# Patient Record
Sex: Female | Born: 1985 | Race: White | Hispanic: No | Marital: Married | Smoking: Never smoker
Health system: Southern US, Community
[De-identification: ages and names within clinical notes are randomized; demographics above are authoritative.]

## PROBLEM LIST (undated history)

## (undated) ENCOUNTER — Inpatient Hospital Stay (HOSPITAL_COMMUNITY): Payer: Self-pay

## (undated) DIAGNOSIS — T4145XA Adverse effect of unspecified anesthetic, initial encounter: Secondary | ICD-10-CM

## (undated) DIAGNOSIS — T8859XA Other complications of anesthesia, initial encounter: Secondary | ICD-10-CM

## (undated) DIAGNOSIS — N3090 Cystitis, unspecified without hematuria: Secondary | ICD-10-CM

## (undated) DIAGNOSIS — Z98891 History of uterine scar from previous surgery: Secondary | ICD-10-CM

## (undated) DIAGNOSIS — O165 Unspecified maternal hypertension, complicating the puerperium: Secondary | ICD-10-CM

## (undated) DIAGNOSIS — Z332 Encounter for elective termination of pregnancy: Secondary | ICD-10-CM

## (undated) DIAGNOSIS — Z789 Other specified health status: Secondary | ICD-10-CM

## (undated) DIAGNOSIS — N12 Tubulo-interstitial nephritis, not specified as acute or chronic: Secondary | ICD-10-CM

## (undated) HISTORY — DX: Encounter for elective termination of pregnancy: Z33.2

## (undated) HISTORY — DX: Tubulo-interstitial nephritis, not specified as acute or chronic: N12

## (undated) HISTORY — DX: Cystitis, unspecified without hematuria: N30.90

## (undated) HISTORY — DX: Unspecified maternal hypertension, complicating the puerperium: O16.5

---
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
## 2009-08-15 ENCOUNTER — Emergency Department (HOSPITAL_COMMUNITY): Admission: EM | Admit: 2009-08-15 | Discharge: 2009-08-16 | Payer: Self-pay | Admitting: Emergency Medicine

## 2010-01-14 DIAGNOSIS — N3090 Cystitis, unspecified without hematuria: Secondary | ICD-10-CM

## 2010-01-14 DIAGNOSIS — N12 Tubulo-interstitial nephritis, not specified as acute or chronic: Secondary | ICD-10-CM

## 2010-01-14 HISTORY — DX: Tubulo-interstitial nephritis, not specified as acute or chronic: N12

## 2010-01-14 HISTORY — DX: Cystitis, unspecified without hematuria: N30.90

## 2010-03-12 ENCOUNTER — Emergency Department (HOSPITAL_COMMUNITY)
Admission: EM | Admit: 2010-03-12 | Discharge: 2010-03-12 | Disposition: A | Discharge status: Home / Self Care | Payer: Self-pay | Attending: Emergency Medicine | Admitting: Emergency Medicine

## 2010-03-12 DIAGNOSIS — Y93B2 Activity, push-ups, pull-ups, sit-ups: Secondary | ICD-10-CM | POA: Insufficient documentation

## 2010-03-12 DIAGNOSIS — T148XXA Other injury of unspecified body region, initial encounter: Secondary | ICD-10-CM | POA: Insufficient documentation

## 2010-03-12 DIAGNOSIS — R109 Unspecified abdominal pain: Secondary | ICD-10-CM | POA: Insufficient documentation

## 2010-03-12 DIAGNOSIS — X500XXA Overexertion from strenuous movement or load, initial encounter: Secondary | ICD-10-CM | POA: Insufficient documentation

## 2010-03-30 LAB — URINALYSIS, ROUTINE W REFLEX MICROSCOPIC
Ketones, ur: 15 mg/dL — AB
Nitrite: POSITIVE — AB
Protein, ur: 100 mg/dL — AB
pH: 6 (ref 5.0–8.0)

## 2010-03-30 LAB — CBC
HCT: 32.6 % — ABNORMAL LOW (ref 36.0–46.0)
Hemoglobin: 11.2 g/dL — ABNORMAL LOW (ref 12.0–15.0)
MCH: 31.9 pg (ref 26.0–34.0)
MCHC: 34.4 g/dL (ref 30.0–36.0)
RDW: 12.8 % (ref 11.5–15.5)

## 2010-03-30 LAB — URINE CULTURE
Colony Count: >=100000
Culture  Setup Time: 201108022220

## 2010-03-30 LAB — URINE MICROSCOPIC-ADD ON

## 2010-03-30 LAB — DIFFERENTIAL
Basophils Absolute: 0 10*3/uL (ref 0.0–0.1)
Basophils Relative: 0 % (ref 0–1)
Eosinophils Absolute: 0 10*3/uL (ref 0.0–0.7)
Monocytes Absolute: 1.9 10*3/uL — ABNORMAL HIGH (ref 0.1–1.0)
Monocytes Relative: 14 % — ABNORMAL HIGH (ref 3–12)
Neutrophils Relative %: 80 % — ABNORMAL HIGH (ref 43–77)

## 2010-03-30 LAB — POCT I-STAT, CHEM 8
Calcium, Ion: 1.04 mmol/L — ABNORMAL LOW (ref 1.12–1.32)
Glucose, Bld: 94 mg/dL (ref 70–99)
HCT: 33 % — ABNORMAL LOW (ref 36.0–46.0)
Hemoglobin: 11.2 g/dL — ABNORMAL LOW (ref 12.0–15.0)

## 2010-11-07 ENCOUNTER — Inpatient Hospital Stay (HOSPITAL_COMMUNITY)
Admission: AD | Admit: 2010-11-07 | Discharge: 2010-11-07 | Disposition: A | Discharge status: Home / Self Care | Payer: Medicaid Other | Source: Ambulatory Visit | Attending: Obstetrics and Gynecology | Admitting: Obstetrics and Gynecology

## 2010-11-07 DIAGNOSIS — Z298 Encounter for other specified prophylactic measures: Secondary | ICD-10-CM | POA: Insufficient documentation

## 2010-11-07 DIAGNOSIS — Z3480 Encounter for supervision of other normal pregnancy, unspecified trimester: Secondary | ICD-10-CM | POA: Insufficient documentation

## 2010-11-07 DIAGNOSIS — Z2989 Encounter for other specified prophylactic measures: Secondary | ICD-10-CM | POA: Insufficient documentation

## 2010-11-07 MED ORDER — RHO D IMMUNE GLOBULIN 1500 UNIT/2ML IJ SOLN
300.0000 ug | Freq: Once | INTRAMUSCULAR | Status: AC
  Administered 2010-11-07: 300 ug via INTRAMUSCULAR
  Filled 2010-11-07: qty 2

## 2010-11-07 NOTE — Plan of Care (Signed)
Rhophylac information booklet given to the patient while waiting in the lobby. Explained the 1 1/2 hours required to process this order. Patient understands.

## 2010-11-08 LAB — RH IG WORKUP (INCLUDES ABO/RH): Gestational Age(Wks): 29

## 2010-11-20 ENCOUNTER — Other Ambulatory Visit: Payer: Self-pay | Admitting: Obstetrics and Gynecology

## 2010-12-28 ENCOUNTER — Encounter (HOSPITAL_COMMUNITY): Payer: Self-pay | Admitting: Pharmacist

## 2011-01-10 ENCOUNTER — Encounter (HOSPITAL_COMMUNITY): Payer: Self-pay

## 2011-01-10 ENCOUNTER — Encounter (HOSPITAL_COMMUNITY)
Admission: RE | Admit: 2011-01-10 | Discharge: 2011-01-10 | Disposition: A | Discharge status: Home / Self Care | Payer: Medicaid Other | Source: Ambulatory Visit | Attending: Obstetrics and Gynecology | Admitting: Obstetrics and Gynecology

## 2011-01-10 HISTORY — DX: Other specified health status: Z78.9

## 2011-01-10 HISTORY — DX: Other complications of anesthesia, initial encounter: T88.59XA

## 2011-01-10 HISTORY — DX: Adverse effect of unspecified anesthetic, initial encounter: T41.45XA

## 2011-01-10 LAB — CBC
Platelets: 229 10*3/uL (ref 150–400)
RDW: 13 % (ref 11.5–15.5)
WBC: 8.4 10*3/uL (ref 4.0–10.5)

## 2011-01-10 LAB — SURGICAL PCR SCREEN
MRSA, PCR: NEGATIVE
Staphylococcus aureus: POSITIVE — AB

## 2011-01-10 LAB — RPR: RPR Ser Ql: NONREACTIVE

## 2011-01-10 NOTE — Patient Instructions (Addendum)
YOUR PROCEDURE IS SCHEDULED ON:01/17/11 ENTER THROUGH THE MAIN ENTRANCE OF [facility] AT:9;30 AM USE DESK PHONE AND DIAL [id number] TO INFORM us OF YOUR ARRIVAL  CALL [phone] IF YOU HAVE ANY QUESTIONS OR PROBLEMS PRIOR TO YOUR ARRIVAL.  REMEMBER: DO NOT EAT OR DRINK AFTER MIDNIGHT :Wed  SPECIAL INSTRUCTIONS:   YOU MAY BRUSH YOUR TEETH THE MORNING OF SURGERY   TAKE THESE MEDICINES THE DAY OF SURGERY WITH SIP OF WATER:none   DO NOT WEAR JEWELRY, EYE MAKEUP, LIPSTICK OR DARK FINGERNAIL POLISH DO NOT WEAR LOTIONS  DO NOT SHAVE FOR 48 HOURS PRIOR TO SURGERY  YOU WILL NOT BE ALLOWED TO DRIVE YOURSELF HOME.  NAME OF DRIVER:Rusty Caudell

## 2011-01-15 ENCOUNTER — Encounter (HOSPITAL_COMMUNITY)
Admission: AD | Disposition: A | Discharge status: Home / Self Care | Payer: Self-pay | Source: Ambulatory Visit | Attending: Obstetrics and Gynecology

## 2011-01-15 ENCOUNTER — Encounter (HOSPITAL_COMMUNITY): Payer: Self-pay | Admitting: Anesthesiology

## 2011-01-15 ENCOUNTER — Encounter (HOSPITAL_COMMUNITY): Payer: Self-pay | Admitting: *Deleted

## 2011-01-15 ENCOUNTER — Inpatient Hospital Stay (HOSPITAL_COMMUNITY): Payer: Medicaid Other | Admitting: Anesthesiology

## 2011-01-15 ENCOUNTER — Inpatient Hospital Stay (HOSPITAL_COMMUNITY)
Admission: AD | Admit: 2011-01-15 | Discharge: 2011-01-18 | DRG: 766 | Disposition: A | Discharge status: Home / Self Care | Payer: Medicaid Other | Source: Ambulatory Visit | Attending: Obstetrics and Gynecology | Admitting: Obstetrics and Gynecology

## 2011-01-15 DIAGNOSIS — Z01818 Encounter for other preprocedural examination: Secondary | ICD-10-CM

## 2011-01-15 DIAGNOSIS — Z01812 Encounter for preprocedural laboratory examination: Secondary | ICD-10-CM

## 2011-01-15 DIAGNOSIS — Z6791 Unspecified blood type, Rh negative: Secondary | ICD-10-CM

## 2011-01-15 DIAGNOSIS — O34219 Maternal care for unspecified type scar from previous cesarean delivery: Principal | ICD-10-CM | POA: Diagnosis present

## 2011-01-15 DIAGNOSIS — Z3490 Encounter for supervision of normal pregnancy, unspecified, unspecified trimester: Secondary | ICD-10-CM

## 2011-01-15 DIAGNOSIS — Z98891 History of uterine scar from previous surgery: Secondary | ICD-10-CM

## 2011-01-15 SURGERY — Surgical Case
Anesthesia: Regional

## 2011-01-15 MED ORDER — NALOXONE HCL 0.4 MG/ML IJ SOLN: 0.4000 mg | INTRAMUSCULAR | Status: DC | PRN

## 2011-01-15 MED ORDER — WITCH HAZEL-GLYCERIN EX PADS: 1.0000 "application " | MEDICATED_PAD | CUTANEOUS | Status: DC | PRN

## 2011-01-15 MED ORDER — LANOLIN HYDROUS EX OINT: 1.0000 "application " | TOPICAL_OINTMENT | CUTANEOUS | Status: DC | PRN

## 2011-01-15 MED ORDER — SIMETHICONE 80 MG PO CHEW
80.0000 mg | CHEWABLE_TABLET | Freq: Three times a day (TID) | ORAL | Status: DC
  Administered 2011-01-15 – 2011-01-18 (×10): 80 mg via ORAL

## 2011-01-15 MED ORDER — PHENYLEPHRINE 40 MCG/ML (10ML) SYRINGE FOR IV PUSH (FOR BLOOD PRESSURE SUPPORT)
PREFILLED_SYRINGE | INTRAVENOUS | Status: AC
  Filled 2011-01-15: qty 5

## 2011-01-15 MED ORDER — PROMETHAZINE HCL 25 MG/ML IJ SOLN
25.0000 mg | Freq: Once | INTRAMUSCULAR | Status: AC
  Administered 2011-01-15: 25 mg via INTRAVENOUS
  Filled 2011-01-15: qty 1

## 2011-01-15 MED ORDER — CITRIC ACID-SODIUM CITRATE 334-500 MG/5ML PO SOLN
30.0000 mL | Freq: Once | ORAL | Status: AC
  Administered 2011-01-15: 30 mL via ORAL
  Filled 2011-01-15: qty 15

## 2011-01-15 MED ORDER — PRENATAL MULTIVITAMIN CH
1.0000 | ORAL_TABLET | Freq: Every day | ORAL | Status: DC
  Administered 2011-01-16 – 2011-01-18 (×3): 1 via ORAL
  Filled 2011-01-15 (×3): qty 1

## 2011-01-15 MED ORDER — MORPHINE SULFATE 0.5 MG/ML IJ SOLN
INTRAMUSCULAR | Status: AC
  Filled 2011-01-15: qty 10

## 2011-01-15 MED ORDER — MEPERIDINE HCL 25 MG/ML IJ SOLN
INTRAMUSCULAR | Status: DC | PRN
  Administered 2011-01-15: 25 mg via INTRAVENOUS

## 2011-01-15 MED ORDER — KETOROLAC TROMETHAMINE 60 MG/2ML IM SOLN
INTRAMUSCULAR | Status: AC
  Administered 2011-01-15: 60 mg via INTRAMUSCULAR
  Filled 2011-01-15: qty 2

## 2011-01-15 MED ORDER — MEASLES, MUMPS & RUBELLA VAC ~~LOC~~ INJ
0.5000 mL | INJECTION | Freq: Once | SUBCUTANEOUS | Status: DC
  Filled 2011-01-15: qty 0.5

## 2011-01-15 MED ORDER — MEPERIDINE HCL 25 MG/ML IJ SOLN
INTRAMUSCULAR | Status: AC
  Filled 2011-01-15: qty 1

## 2011-01-15 MED ORDER — FENTANYL CITRATE 0.05 MG/ML IJ SOLN
INTRAMUSCULAR | Status: DC | PRN
  Administered 2011-01-15: 25 ug via INTRATHECAL

## 2011-01-15 MED ORDER — IBUPROFEN 600 MG PO TABS
600.0000 mg | ORAL_TABLET | Freq: Four times a day (QID) | ORAL | Status: DC
  Administered 2011-01-15 – 2011-01-18 (×12): 600 mg via ORAL
  Filled 2011-01-15 (×6): qty 1

## 2011-01-15 MED ORDER — DIPHENHYDRAMINE HCL 50 MG/ML IJ SOLN: 12.5000 mg | INTRAMUSCULAR | Status: DC | PRN

## 2011-01-15 MED ORDER — OXYTOCIN 20 UNITS IN LACTATED RINGERS INFUSION - SIMPLE
INTRAVENOUS | Status: DC | PRN
  Administered 2011-01-15: 20 [IU] via INTRAVENOUS

## 2011-01-15 MED ORDER — SODIUM CHLORIDE 0.9 % IV SOLN
1.0000 ug/kg/h | INTRAVENOUS | Status: DC | PRN
  Filled 2011-01-15: qty 2.5

## 2011-01-15 MED ORDER — CEFAZOLIN SODIUM-DEXTROSE 2-3 GM-% IV SOLR
2.0000 g | Freq: Once | INTRAVENOUS | Status: DC
  Filled 2011-01-15: qty 50

## 2011-01-15 MED ORDER — FAMOTIDINE IN NACL 20-0.9 MG/50ML-% IV SOLN
20.0000 mg | Freq: Once | INTRAVENOUS | Status: AC
  Administered 2011-01-15: 20 mg via INTRAVENOUS
  Filled 2011-01-15: qty 50

## 2011-01-15 MED ORDER — MORPHINE SULFATE (PF) 0.5 MG/ML IJ SOLN
INTRAMUSCULAR | Status: DC | PRN
  Administered 2011-01-15: .15 mg via INTRATHECAL

## 2011-01-15 MED ORDER — SIMETHICONE 80 MG PO CHEW
80.0000 mg | CHEWABLE_TABLET | ORAL | Status: DC | PRN
Start: 2011-01-15 — End: 2011-01-18

## 2011-01-15 MED ORDER — LACTATED RINGERS IV BOLUS (SEPSIS)
1000.0000 mL | Freq: Once | INTRAVENOUS | Status: AC
  Administered 2011-01-15: 1000 mL via INTRAVENOUS

## 2011-01-15 MED ORDER — DEXTROSE 5 % IV SOLN
1.0000 g | INTRAVENOUS | Status: AC
  Administered 2011-01-15: 1 g via INTRAVENOUS
  Filled 2011-01-15: qty 1

## 2011-01-15 MED ORDER — PHENYLEPHRINE HCL 10 MG/ML IJ SOLN
INTRAMUSCULAR | Status: DC | PRN
  Administered 2011-01-15: 80 ug via INTRAVENOUS

## 2011-01-15 MED ORDER — MENTHOL 3 MG MT LOZG: 1.0000 | LOZENGE | OROMUCOSAL | Status: DC | PRN

## 2011-01-15 MED ORDER — NALBUPHINE SYRINGE 5 MG/0.5 ML
5.0000 mg | INJECTION | INTRAMUSCULAR | Status: DC | PRN
  Filled 2011-01-15: qty 1

## 2011-01-15 MED ORDER — KETOROLAC TROMETHAMINE 30 MG/ML IJ SOLN: 30.0000 mg | Freq: Four times a day (QID) | INTRAMUSCULAR | Status: AC | PRN

## 2011-01-15 MED ORDER — NALBUPHINE SYRINGE 5 MG/0.5 ML
10.0000 mg | INJECTION | Freq: Once | INTRAMUSCULAR | Status: AC
  Administered 2011-01-15: 10 mg via INTRAVENOUS
  Filled 2011-01-15: qty 1

## 2011-01-15 MED ORDER — IBUPROFEN 600 MG PO TABS
600.0000 mg | ORAL_TABLET | Freq: Four times a day (QID) | ORAL | Status: DC | PRN
  Filled 2011-01-15 (×5): qty 1

## 2011-01-15 MED ORDER — KETOROLAC TROMETHAMINE 60 MG/2ML IM SOLN
60.0000 mg | Freq: Once | INTRAMUSCULAR | Status: AC | PRN
  Administered 2011-01-15: 60 mg via INTRAMUSCULAR

## 2011-01-15 MED ORDER — TETANUS-DIPHTH-ACELL PERTUSSIS 5-2.5-18.5 LF-MCG/0.5 IM SUSP: 0.5000 mL | Freq: Once | INTRAMUSCULAR | Status: DC

## 2011-01-15 MED ORDER — OXYTOCIN 10 UNIT/ML IJ SOLN
INTRAMUSCULAR | Status: AC
  Filled 2011-01-15: qty 4

## 2011-01-15 MED ORDER — FLEET ENEMA 7-19 GM/118ML RE ENEM: 1.0000 | ENEMA | Freq: Every day | RECTAL | Status: DC | PRN

## 2011-01-15 MED ORDER — ONDANSETRON HCL 4 MG/2ML IJ SOLN
INTRAMUSCULAR | Status: DC | PRN
  Administered 2011-01-15: 4 mg via INTRAVENOUS

## 2011-01-15 MED ORDER — METOCLOPRAMIDE HCL 5 MG/ML IJ SOLN: 10.0000 mg | Freq: Three times a day (TID) | INTRAMUSCULAR | Status: DC | PRN

## 2011-01-15 MED ORDER — ZOLPIDEM TARTRATE 5 MG PO TABS: 5.0000 mg | ORAL_TABLET | Freq: Every evening | ORAL | Status: DC | PRN

## 2011-01-15 MED ORDER — SCOPOLAMINE 1 MG/3DAYS TD PT72
MEDICATED_PATCH | TRANSDERMAL | Status: AC
  Filled 2011-01-15: qty 1

## 2011-01-15 MED ORDER — SCOPOLAMINE 1 MG/3DAYS TD PT72
1.0000 | MEDICATED_PATCH | Freq: Once | TRANSDERMAL | Status: AC
  Administered 2011-01-15: 1.5 mg via TRANSDERMAL

## 2011-01-15 MED ORDER — FENTANYL CITRATE 0.05 MG/ML IJ SOLN
INTRAMUSCULAR | Status: AC
  Filled 2011-01-15: qty 2

## 2011-01-15 MED ORDER — SODIUM CHLORIDE 0.9 % IJ SOLN: 3.0000 mL | INTRAMUSCULAR | Status: DC | PRN

## 2011-01-15 MED ORDER — DIPHENHYDRAMINE HCL 25 MG PO CAPS: 25.0000 mg | ORAL_CAPSULE | ORAL | Status: DC | PRN

## 2011-01-15 MED ORDER — OXYTOCIN 20 UNITS IN LACTATED RINGERS INFUSION - SIMPLE
125.0000 mL/h | INTRAVENOUS | Status: AC
  Administered 2011-01-15: 125 mL/h via INTRAVENOUS

## 2011-01-15 MED ORDER — MEPERIDINE HCL 25 MG/ML IJ SOLN: 6.2500 mg | INTRAMUSCULAR | Status: DC | PRN

## 2011-01-15 MED ORDER — DIPHENHYDRAMINE HCL 25 MG PO CAPS: 25.0000 mg | ORAL_CAPSULE | Freq: Four times a day (QID) | ORAL | Status: DC | PRN

## 2011-01-15 MED ORDER — SENNOSIDES-DOCUSATE SODIUM 8.6-50 MG PO TABS
2.0000 | ORAL_TABLET | Freq: Every day | ORAL | Status: DC
  Administered 2011-01-15 – 2011-01-17 (×3): 2 via ORAL

## 2011-01-15 MED ORDER — DIBUCAINE 1 % RE OINT: 1.0000 "application " | TOPICAL_OINTMENT | RECTAL | Status: DC | PRN

## 2011-01-15 MED ORDER — HYDROMORPHONE HCL PF 1 MG/ML IJ SOLN: 0.2500 mg | INTRAMUSCULAR | Status: DC | PRN

## 2011-01-15 MED ORDER — FERROUS SULFATE 325 (65 FE) MG PO TABS
325.0000 mg | ORAL_TABLET | Freq: Two times a day (BID) | ORAL | Status: DC
  Administered 2011-01-15 – 2011-01-18 (×6): 325 mg via ORAL
  Filled 2011-01-15 (×6): qty 1

## 2011-01-15 MED ORDER — DIPHENHYDRAMINE HCL 50 MG/ML IJ SOLN: 25.0000 mg | INTRAMUSCULAR | Status: DC | PRN

## 2011-01-15 MED ORDER — LACTATED RINGERS IV SOLN
INTRAVENOUS | Status: DC
  Administered 2011-01-15: 12:00:00 via INTRAVENOUS

## 2011-01-15 MED ORDER — KETOROLAC TROMETHAMINE 30 MG/ML IJ SOLN
30.0000 mg | Freq: Four times a day (QID) | INTRAMUSCULAR | Status: AC | PRN
  Administered 2011-01-15: 30 mg via INTRAVENOUS
  Filled 2011-01-15: qty 1

## 2011-01-15 MED ORDER — BISACODYL 10 MG RE SUPP: 10.0000 mg | Freq: Every day | RECTAL | Status: DC | PRN

## 2011-01-15 MED ORDER — LACTATED RINGERS IV SOLN
INTRAVENOUS | Status: DC | PRN
  Administered 2011-01-15 (×3): via INTRAVENOUS

## 2011-01-15 MED ORDER — ONDANSETRON HCL 4 MG PO TABS: 4.0000 mg | ORAL_TABLET | ORAL | Status: DC | PRN

## 2011-01-15 MED ORDER — OXYTOCIN 20 UNITS IN LACTATED RINGERS INFUSION - SIMPLE
INTRAVENOUS | Status: AC
  Administered 2011-01-15: 125 mL/h via INTRAVENOUS
  Filled 2011-01-15: qty 1000

## 2011-01-15 MED ORDER — ONDANSETRON HCL 4 MG/2ML IJ SOLN
INTRAMUSCULAR | Status: AC
  Filled 2011-01-15: qty 2

## 2011-01-15 MED ORDER — OXYCODONE-ACETAMINOPHEN 5-325 MG PO TABS
1.0000 | ORAL_TABLET | ORAL | Status: DC | PRN
  Administered 2011-01-15 – 2011-01-18 (×13): 1 via ORAL
  Filled 2011-01-15 (×12): qty 1

## 2011-01-15 MED ORDER — ONDANSETRON HCL 4 MG/2ML IJ SOLN
4.0000 mg | INTRAMUSCULAR | Status: DC | PRN
Start: 2011-01-15 — End: 2011-01-18

## 2011-01-15 MED ORDER — ONDANSETRON HCL 4 MG/2ML IJ SOLN: 4.0000 mg | Freq: Three times a day (TID) | INTRAMUSCULAR | Status: DC | PRN

## 2011-01-15 SURGICAL SUPPLY — 38 items
BENZOIN TINCTURE PRP APPL 2/3 (GAUZE/BANDAGES/DRESSINGS) ×2 IMPLANT
CHLORAPREP W/TINT 26ML (MISCELLANEOUS) ×2 IMPLANT
CLOTH BEACON ORANGE TIMEOUT ST (SAFETY) ×2 IMPLANT
CONTAINER PREFILL 10% NBF 15ML (MISCELLANEOUS) IMPLANT
DRAIN JACKSON PRT FLT 10 (DRAIN) ×2 IMPLANT
DRESSING TELFA 8X3 (GAUZE/BANDAGES/DRESSINGS) ×2 IMPLANT
DRSG COVADERM 4X10 (GAUZE/BANDAGES/DRESSINGS) ×2 IMPLANT
ELECT REM PT RETURN 9FT ADLT (ELECTROSURGICAL) ×2
ELECTRODE REM PT RTRN 9FT ADLT (ELECTROSURGICAL) ×1 IMPLANT
EVACUATOR SILICONE 100CC (DRAIN) ×2 IMPLANT
EXTRACTOR VACUUM M CUP 4 TUBE (SUCTIONS) IMPLANT
GAUZE SPONGE 4X4 12PLY STRL LF (GAUZE/BANDAGES/DRESSINGS) ×2 IMPLANT
GLOVE BIO SURGEON STRL SZ7.5 (GLOVE) ×4 IMPLANT
GLOVE BIOGEL PI IND STRL 7.5 (GLOVE) ×2 IMPLANT
GLOVE BIOGEL PI INDICATOR 7.5 (GLOVE) ×2
GLOVE SURG SS PI 7.5 STRL IVOR (GLOVE) ×2 IMPLANT
GOWN PREVENTION PLUS LG XLONG (DISPOSABLE) ×6 IMPLANT
KIT ABG SYR 3ML LUER SLIP (SYRINGE) IMPLANT
NEEDLE HYPO 22GX1.5 SAFETY (NEEDLE) IMPLANT
NEEDLE HYPO 25X5/8 SAFETYGLIDE (NEEDLE) IMPLANT
NS IRRIG 1000ML POUR BTL (IV SOLUTION) ×2 IMPLANT
PACK C SECTION WH (CUSTOM PROCEDURE TRAY) ×2 IMPLANT
PAD ABD 7.5X8 STRL (GAUZE/BANDAGES/DRESSINGS) ×2 IMPLANT
RETRACTOR WND ALEXIS 25 LRG (MISCELLANEOUS) ×1 IMPLANT
RTRCTR WOUND ALEXIS 25CM LRG (MISCELLANEOUS) ×2
SLEEVE SCD COMPRESS KNEE MED (MISCELLANEOUS) IMPLANT
STRIP CLOSURE SKIN 1/2X4 (GAUZE/BANDAGES/DRESSINGS) ×2 IMPLANT
SUT CHROMIC 2 0 CT 1 (SUTURE) ×2 IMPLANT
SUT MNCRL AB 3-0 PS2 27 (SUTURE) ×2 IMPLANT
SUT PLAIN 0 NONE (SUTURE) IMPLANT
SUT PLAIN 2 0 XLH (SUTURE) ×2 IMPLANT
SUT VIC AB 0 CT1 36 (SUTURE) ×2 IMPLANT
SUT VIC AB 0 CTX 36 (SUTURE) ×3
SUT VIC AB 0 CTX36XBRD ANBCTRL (SUTURE) ×3 IMPLANT
SYR CONTROL 10ML LL (SYRINGE) IMPLANT
TOWEL OR 17X24 6PK STRL BLUE (TOWEL DISPOSABLE) ×4 IMPLANT
TRAY FOLEY CATH 14FR (SET/KITS/TRAYS/PACK) ×2 IMPLANT
WATER STERILE IRR 1000ML POUR (IV SOLUTION) ×2 IMPLANT

## 2011-01-15 NOTE — Op Note (Signed)
Cesarean Section Procedure Note   Katherine Riggs  01/15/2011  Indications: Scheduled Proceedure/Maternal Request and and contractions   Pre-operative Diagnosis: Previous Cesarean, in labor.   Post-operative Diagnosis: Same   Surgeon: Surgeon(s) and Role:    * Michael Litter, MD - Primary   Assistants: Gevena Barre  Anesthesia: spinal   Procedure Details:  The patient was seen in the Holding Room. The risks, benefits, complications, treatment options, and expected outcomes were discussed with the patient. The patient concurred with the proposed plan, giving informed consent. identified as Katherine Riggs and the procedure verified as C-Section Delivery. A Time Out was held and the above information confirmed.  After induction of anesthesia, the patient was draped and prepped in the usual sterile manner. A transverse incision was made and carried down through the subcutaneous tissue to the fascia. Fascial incision was made in the midline and extended transversely. The fascia was separated from the underlying rectus muscle superiorly and inferiorly. The peritoneum was identified and entered. Peritoneal incision was extended longitudinally with good visualization of bowel and bladder. The utero-vesical peritoneal reflection was incised transversely and the bladder flap was bluntly freed from the lower uterine segment.An alexis retractor was placed in the abdomen.   A low transverse uterine incision was made. Delivered from cephalic presentation was a 5-14 pound infant, with Apgar scores of 9 at one minute and 10 at five minutes. Cord ph was not sent the umbilical cord was clamped and cut cord blood was obtained for evaluation. The placenta was removed Intact and appeared normal. The uterine outline, tubes and ovaries appeared normal}. The uterine incision was closed with running locked sutures of 0Vicryl. A second layer 0 vicrlyl was used to imbricate the uterine incision.   Two figure of eight suture  with 0 vicryl was used to obtain hemostasis.      Hemostasis was observed. Lavage was carried out until clear. The fascia was then reapproximated with running sutures of 0plain gut. The subcuticular closure was performed using 3-60monocryl     Instrument, sponge, and needle counts were correct prior the abdominal closure and were correct at the conclusion of the case.    Findings:female infant in vtx presentation. Nuchal cord times one, easily reduced.  Clear fluid.  Normal appearing uterus, tubes ovaries and abdominal anatomy   Estimated Blood Loss  700cc  Total IV Fluids:   Urine Output: 500CC OF clear urine  Specimens: none  Complications: no complications  Disposition: PACU - hemodynamically stable.   Maternal Condition: stable   Baby condition / location:  nursery-stable  Attending Attestation: I was present and scrubbed for the entire procedure.   Signed: Surgeon(s): Michael Litter, MD

## 2011-01-15 NOTE — Addendum Note (Signed)
Addendum  created 01/15/11 1603 by Fanny Dance   Modules edited:Notes Section

## 2011-01-15 NOTE — H&P (Signed)
Katherine Riggs is a 26 y.o. female presenting for ctx. See MAU note.  HPI: Pt began PNC at [facility] at 17wks. She had an anatomy US at 20w that was normal, however EDC was changed to 1/8 from 12/26 based on 20wk measurements. Pt had a normal quad screen. She received rhophylac at 29wks, and had a normal 1hr gtt, NR RPR and a hgb of 10.9. GBS was neg done at 35.6wks. Pt elected to be scheduled for repeat c/s.    Maternal Medical History:  Reason for admission: Reason for admission: contractions.  Contractions: Onset was 2 days ago.   Frequency: regular.   Duration is approximately 50 seconds.   Perceived severity is moderate.    Fetal activity: Perceived fetal activity is normal.    Prenatal complications: no prenatal complications   OB History    Grav Para Term Preterm Abortions TAB SAB Ect Mult Living   3 1 1  1 1    1      12/08 - C/S for malposition - female 6#6oz at approx 39wks - reports PP HTN - no meds 12/10 - EAB no comps 12/11 - EAB no comps   Past Medical History  Diagnosis Date  . No pertinent past medical history   . Complication of anesthesia     high BP in PACU- resolved in PACU   Past Surgical History  Procedure Date  . Cesarean section    Family History: family history is not on file. MI - PGM CHTN - PGM, PGF Diabetes - PGM, PA Migraines - mother Breast CA - MGM Esophageal CA - MGM  Social History:  reports that she has never smoked. She does not have any smokeless tobacco history on file. She reports that she does not drink alcohol or use illicit drugs. 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 Lives with Toney Sang, that is a different FOB from first child.   Review of Systems  Musculoskeletal: Positive for joint pain.       Pelvic pain  All other systems reviewed and are negative.    Dilation: 1 Effacement (%): 50 Station: -2 Exam by:: Owais Pruett CNM Blood pressure 129/71, pulse 88, temperature 98.3 F (36.8 C), resp. rate 18, height 5'  1" (1.549 m), weight 91.173 kg (201 lb), last menstrual period 04/04/2010. Maternal Exam:  Uterine Assessment: Contraction strength is moderate.  Contraction duration is 50 seconds. Contraction frequency is regular.   Abdomen: Patient reports no abdominal tenderness. Fundal height is aga.   Estimated fetal weight is 7#.   Fetal presentation: vertex  Introitus: Normal vulva. Normal vagina.  Ferning test: not done.   Pelvis: adequate for delivery.   Cervix: Cervix evaluated by digital exam.     Fetal Exam Fetal Monitor Review: Mode: ultrasound.   Baseline rate: 110.  Variability: moderate (6-25 bpm).   Pattern: accelerations present and no decelerations.    Fetal State Assessment: Category I - tracings are normal.     Physical Exam  Nursing note and vitals reviewed. Constitutional: She is oriented to person, place, and time. She appears well-developed and well-nourished.  Cardiovascular: Normal rate and normal heart sounds.   Respiratory: Effort normal and breath sounds normal.  GI: Soft. Bowel sounds are normal.  Genitourinary: Vagina normal.  Musculoskeletal: Normal range of motion. She exhibits edema.       Bilateral LE 1+  Neurological: She is alert and oriented to person, place, and time. She has normal reflexes.  Skin: Skin is  warm and dry.  Psychiatric: She has a normal mood and affect. Her behavior is normal. Judgment and thought content normal.    Prenatal labs: ABO, Rh: --/--/A NEG (10/24 1208) Antibody: NEG (10/24 1208) Rubella:  IMM RPR: NON REACTIVE (12/27 1010)  HBsAg:   neg HIV:   neg GBS:   neg MRSA PCR - neg  Assessment/Plan: Admit to IP Routine pre-op orders Risks of C/S including bleeding, infection, anesthesia complications, and organ damage reviewed, pt understands these risks and agrees to proceed.     Mahsa Hanser M 01/15/2011, 4:35 AM

## 2011-01-15 NOTE — Anesthesia Preprocedure Evaluation (Addendum)
Anesthesia Evaluation    Airway       Dental   Pulmonary          Cardiovascular     Neuro/Psych    GI/Hepatic   Endo/Other  Morbid obesity  Renal/GU      Musculoskeletal   Abdominal   Peds  Hematology   Anesthesia Other Findings   Reproductive/Obstetrics                          Anesthesia Physical Anesthesia Plan  ASA: III  Anesthesia Plan: Spinal   Post-op Pain Management:    Induction:   Airway Management Planned:   Additional Equipment:   Intra-op Plan:   Post-operative Plan:   Informed Consent: I have reviewed the patients History and Physical, chart, labs and discussed the procedure including the risks, benefits and alternatives for the proposed anesthesia with the patient or authorized representative who has indicated his/her understanding and acceptance.   Dental Advisory Given  Plan Discussed with: CRNA  Anesthesia Plan Comments: (Lab work confirmed with CRNA in room. Platelets okay. Discussed spinal anesthetic, and patient consents to the procedure:  included risk of possible headache,backache, failed block, allergic reaction, and nerve injury. This patient was asked if she had any questions or concerns before the procedure started. )       Anesthesia Quick Evaluation

## 2011-01-15 NOTE — Anesthesia Postprocedure Evaluation (Signed)
Anesthesia Post Note  Patient: Katherine Riggs  Procedure(s) Performed:  CESAREAN SECTION  Anesthesia type: Spinal  Patient location: PACU  Post pain: Pain level controlled  Post assessment: Post-op Vital signs reviewed  Last Vitals:  Filed Vitals:   01/15/11 0910  BP:   Pulse: 60  Temp: 36.6 C  Resp: 16    Post vital signs: stable  Level of consciousness: awake  Complications: No apparent anesthesia complications

## 2011-01-15 NOTE — ED Provider Notes (Signed)
History     Chief Complaint  Patient presents with  . Labor Eval   HPI Comments: Pt is a 25yo G3P1011 at 39w today with cc of ctx. Had called CNM on call Sunday with c/o 20 min ctx, states today they have been 8 min apart and has had increase mucous d/c and pink show. No LOF, +FM. Pt is scheduled for repeat C/S on 01/17/11.  Pregnancy significant for: 1. RH neg 2. LTC 3. HX C/S secondary to head position - desires repeat 4. Hx of high BP at term - no meds  5. Hx pyelo 6. New FOB 7. Hx EAB x2      Past Medical History  Diagnosis Date  . No pertinent past medical history   . Complication of anesthesia     high BP in PACU- resolved in PACU    Past Surgical History  Procedure Date  . Cesarean section     No family history on file.  History  Substance Use Topics  . Smoking status: Never Smoker   . Smokeless tobacco: Not on file  . Alcohol Use: No    Allergies: No Known Allergies  Prescriptions prior to admission  Medication Sig Dispense Refill  . DM-Phenylephrine-Acetaminophen (TYLENOL COLD MULTI-SYMPTOM DAY PO) Take 10 mLs by mouth every 6 (six) hours as needed. For cold symptoms       . Phenylephrine-APAP-Guaifenesin (TYLENOL SINUS CONGESTION/PAIN) 5-325-200 MG TABS Take 2 tablets by mouth every 4 (four) hours as needed. For cold symptoms        . prenatal vitamin w/FE, FA (PRENATAL 1 + 1) 27-1 MG TABS Take 1 tablet by mouth daily.          Review of Systems  Musculoskeletal: Positive for joint pain.       Pelvic pain  All other systems reviewed and are negative.   Physical Exam   Blood pressure 129/71, pulse 88, temperature 98.3 F (36.8 C), resp. rate 18, height 5\' 1"  (1.549 m), weight 91.173 kg (201 lb), last menstrual period 04/04/2010.  Physical Exam  Nursing note and vitals reviewed. Constitutional: She is oriented to person, place, and time. She appears well-developed and well-nourished.  Cardiovascular: Normal rate.   Respiratory: Effort normal.    GI: Soft.  Genitourinary: Vaginal discharge found.       Pink d/c  Musculoskeletal: Normal range of motion.  Neurological: She is alert and oriented to person, place, and time.  Skin: Skin is warm and dry.  Psychiatric: She has a normal mood and affect. Her behavior is normal.   FHR 130 reactive CAT ! toco 5-6 Pelvic - 1cm/100/-2 vtx  MAU Course  Procedures    Assessment and Plan  IUP at 39w Possible early labor  Hx c/s desires repeat GBS neg FHR reassuring preop labs WNL MRSA PCR neg  Will hydrate with LR Nubain/phenergan Recheck cx   Maryah Marinaro M 01/15/2011, 1:54 AM

## 2011-01-15 NOTE — Anesthesia Postprocedure Evaluation (Signed)
  Anesthesia Post-op Note  Patient: Katherine Riggs  Procedure(s) Performed:  CESAREAN SECTION  Patient Location: Mother/Baby  Anesthesia Type: Spinal  Level of Consciousness: alert  and oriented  Airway and Oxygen Therapy: Patient Spontanous Breathing  Post-op Pain: mild  Post-op Assessment: Patient's Cardiovascular Status Stable and Respiratory Function Stable  Post-op Vital Signs: stable  Complications: No apparent anesthesia complications

## 2011-01-15 NOTE — Transfer of Care (Signed)
Immediate Anesthesia Transfer of Care Note  Patient: Katherine Riggs  Procedure(s) Performed:  CESAREAN SECTION  Patient Location: PACU  Anesthesia Type: Spinal  Level of Consciousness: awake  Airway & Oxygen Therapy: Patient Spontanous Breathing  Post-op Assessment: Report given to PACU RN  Post vital signs: Reviewed and stable  Complications: No apparent anesthesia complications

## 2011-01-15 NOTE — Anesthesia Procedure Notes (Signed)

## 2011-01-15 NOTE — Progress Notes (Signed)
S: pt has been sleeping, states ctx are getting stronger again just recently O: FHR 110 reactive, no decels, less accels after pain meds, CAT I toco - initially spaced out after IVF's to 5-6, now are 2-4 Cx=1/long/-2 vtx,  No change, initial exam may have been with ctx  A: term IUP  Ctx persist, no cervical change Likely early/prodrome labor P: D/W Dr Normand Sloop,  Talked to OR OR will call when ready for PT Will consent for repeat C/S

## 2011-01-15 NOTE — Progress Notes (Signed)
Pt presents for contractions q 6 minutes. Denies bleeding or ROM

## 2011-01-16 ENCOUNTER — Encounter (HOSPITAL_COMMUNITY): Payer: Self-pay | Admitting: Obstetrics and Gynecology

## 2011-01-16 LAB — CBC
HCT: 28.4 % — ABNORMAL LOW (ref 36.0–46.0)
Hemoglobin: 9.5 g/dL — ABNORMAL LOW (ref 12.0–15.0)
MCV: 93.1 fL (ref 78.0–100.0)
WBC: 9.3 10*3/uL (ref 4.0–10.5)

## 2011-01-16 MED ORDER — RHO D IMMUNE GLOBULIN 1500 UNIT/2ML IJ SOLN
300.0000 ug | Freq: Once | INTRAMUSCULAR | Status: AC
  Administered 2011-01-16: 300 ug via INTRAMUSCULAR
  Filled 2011-01-16: qty 2

## 2011-01-16 NOTE — Progress Notes (Signed)
Subjective: Postpartum Day 1: Cesarean Delivery--repeat, presented in labor. Patient reports tolerating PO, + flatus and no problems voiding.  Up in shower, no syncope or dizziness. Breastfeeding.   Objective: Vital signs in last 24 hours: Temp:  [97.5 F (36.4 C)-98.7 F (37.1 C)] 98.5 F (36.9 C) (01/02 0620) Pulse Rate:  [65-102] 87  (01/02 0620) Resp:  [16-18] 18  (01/02 0620) BP: (100-124)/(64-80) 112/71 mmHg (01/02 0620) SpO2:  [97 %-100 %] 97 % (01/02 0620)  Physical Exam:  General: alert Lochia: appropriate Uterine Fundus: firm Incision: Dressing CDI before removing in shower. DVT Evaluation: No evidence of DVT seen on physical exam. Negative Homan's sign.  JP drain--45 cc in last 24 hours, serosanguinous drainage.  Basename 01/16/11 0520  HGB 9.5*  HCT 28.4*   Pre-op Hgb 11.4  Assessment/Plan: Status post Cesarean section. Doing well postoperatively.  Continue current care.  Nigel Bridgeman 01/16/2011, 10:27 AM

## 2011-01-16 NOTE — Progress Notes (Signed)
UR chart review completed.  

## 2011-01-17 ENCOUNTER — Encounter (HOSPITAL_COMMUNITY): Admission: RE | Payer: Self-pay | Source: Ambulatory Visit

## 2011-01-17 ENCOUNTER — Inpatient Hospital Stay (HOSPITAL_COMMUNITY)
Admission: RE | Admit: 2011-01-17 | Payer: Medicaid Other | Source: Ambulatory Visit | Admitting: Obstetrics and Gynecology

## 2011-01-17 LAB — RH IG WORKUP (INCLUDES ABO/RH)
ABO/RH(D): A NEG
Fetal Screen: NEGATIVE
Unit division: 0

## 2011-01-17 SURGERY — Surgical Case
Anesthesia: Spinal

## 2011-01-17 NOTE — Progress Notes (Addendum)
Subjective: Postpartum Day 2: Cesarean Delivery, repeat, admitted for labor Patient reports no problems voiding.  Working on feedings.  Pain well-controlled with po meds.  Objective: Vital signs in last 24 hours: Temp:  [98 F (36.7 C)-98.3 F (36.8 C)] 98 F (36.7 C) (01/03 0512) Pulse Rate:  [79-94] 79  (01/03 0512) Resp:  [18] 18  (01/03 0512) BP: (99-111)/(64-78) 99/64 mmHg (01/03 0512)  Physical Exam:  General: alert Lochia: appropriate Uterine Fundus: firm Incision: healing well DVT Evaluation: No evidence of DVT seen on physical exam. 1+ edema, not calf tenderness. Negative Homan's sign.   Basename 01/16/11 0520  HGB 9.5*  HCT 28.4*   JP drain 20 cc last 24 hours  Assessment/Plan: Status post Cesarean section. Doing well postoperatively.  Continue current care. Anticipate d/c 01/18/11.  LATHAM, VICKI 01/17/2011, 1:02 PM  Agree with above - AYR

## 2011-01-18 MED ORDER — IBUPROFEN 600 MG PO TABS: 600.0000 mg | ORAL_TABLET | Freq: Four times a day (QID) | ORAL | Status: AC | PRN

## 2011-01-18 MED ORDER — OXYCODONE-ACETAMINOPHEN 5-325 MG PO TABS: 1.0000 | ORAL_TABLET | ORAL | Status: AC | PRN

## 2011-01-18 NOTE — Discharge Summary (Signed)
Obstetric Discharge Summary Reason for Admission: cesarean section, contractions Prenatal Procedures: ultrasound Intrapartum Procedures: cesarean: low cervical, transverse Postpartum Procedures: none Complications-Operative and Postpartum: none  Hospital course:  presented with contractions unresolved after hydration, had repeat cesarean section, uneventful recovery normal involution. Anemia Hemoglobin  Date Value Range Status  01/16/2011 9.5* 12.0-15.0 (g/dL) Final     HCT  Date Value Range Status  01/16/2011 28.4* 36.0-46.0 (%) Final   Denies needs comfortable with pain meds, undecided birth control Lungs clear bilaterally on auscultation, AP regular, abd soft, nt, bowel sounds active, incision well approximated no drainage, redness, or edema, JP removed intact by Dr. Pennie Rushing. Discharge Diagnoses: Term Pregnancy-delivered repeat C/S  Discharge Information: Date: 01/18/2011 Activity: pelvic rest Diet: regular Medications: Ibuprofen and Percocet Condition: stable Instructions: refer to practice specific booklet Discharge to: home Follow-up Information    Follow up with [facility] in 6 weeks. (As needed)         Birth control options reviewed, undecided.  Newborn Data: Live born female  Birth Weight: 5 lb 14.9 oz (2690 g) APGAR: 9, 10  Home with baby not discharged from hospital.  [facility] 01/18/2011, 1:52 PM

## 2011-11-11 ENCOUNTER — Encounter: Payer: Self-pay | Admitting: Obstetrics and Gynecology

## 2011-11-11 ENCOUNTER — Telehealth: Payer: Self-pay | Admitting: Obstetrics and Gynecology

## 2011-11-11 DIAGNOSIS — Z3490 Encounter for supervision of normal pregnancy, unspecified, unspecified trimester: Secondary | ICD-10-CM

## 2011-11-11 NOTE — Telephone Encounter (Signed)
LM for pt tcb. 

## 2011-11-11 NOTE — Telephone Encounter (Signed)
TC from pt. Pt has NOB scheduled 11/21/11 with [facility]. Pt unsure of LMP "end June middle July or early Aug". Informed pt we will consult with provider to get an order to scheduled her an u/s along with her NOB appt. Pt agrees and voices understanding.

## 2011-11-12 ENCOUNTER — Telehealth: Payer: Self-pay | Admitting: Obstetrics and Gynecology

## 2011-11-12 NOTE — Telephone Encounter (Signed)
Pt returning your call

## 2011-11-12 NOTE — Telephone Encounter (Signed)
Informed pt dating u/s was ordered & was approved by [facility]. U/S appointment scheduled 11/21/11 @ 1p. Pt agrees.

## 2011-11-21 ENCOUNTER — Ambulatory Visit (INDEPENDENT_AMBULATORY_CARE_PROVIDER_SITE_OTHER): Payer: Medicaid Other

## 2011-11-21 ENCOUNTER — Encounter: Payer: Self-pay | Admitting: Obstetrics and Gynecology

## 2011-11-21 ENCOUNTER — Ambulatory Visit (INDEPENDENT_AMBULATORY_CARE_PROVIDER_SITE_OTHER): Payer: Medicaid Other | Admitting: Obstetrics and Gynecology

## 2011-11-21 VITALS — BP 110/68 | Wt 186.6 lb

## 2011-11-21 DIAGNOSIS — O034 Incomplete spontaneous abortion without complication: Secondary | ICD-10-CM | POA: Insufficient documentation

## 2011-11-21 DIAGNOSIS — N12 Tubulo-interstitial nephritis, not specified as acute or chronic: Secondary | ICD-10-CM

## 2011-11-21 DIAGNOSIS — Z3490 Encounter for supervision of normal pregnancy, unspecified, unspecified trimester: Secondary | ICD-10-CM

## 2011-11-21 DIAGNOSIS — Z331 Pregnant state, incidental: Secondary | ICD-10-CM

## 2011-11-21 LAB — POCT URINALYSIS DIPSTICK
Leukocytes, UA: NEGATIVE
Protein, UA: NEGATIVE
Urobilinogen, UA: NEGATIVE

## 2011-11-21 LAB — US OB COMP LESS 14 WKS

## 2011-11-21 NOTE — Progress Notes (Signed)
Subjective:    Katherine Riggs is being seen today for her first obstetrical visit at 69 1/[redacted] weeks gestation by [place] today, due to uncertain LMP.  She reports doing well--some fatigue, occasional dizziness, with feelings like her sugar is low.  No hx diabetes.  Plans repeat C/S, but declines tubal. Will plan long-term contraception (likely Mirena) pp.  Her obstetrical history is significant for: Patient Active Problem List  Diagnosis  . Rh negative, maternal  . History of cesarean delivery x 2  . TAB X 2  . Hx pyelonephritis    Relationship with FOB:  Married, supportive, present with her today--same partner as last baby.  Feeding plan:   Bottle  Pregnancy history fully reviewed.  The following portions of the patient's history were reviewed and updated as appropriate: allergies, current medications, past family history, past medical history, past social history, past surgical history and problem list.  Review of Systems Pertinent ROS is described in HPI   Objective:   BP 110/68  Wt 186 lb 9.6 oz (84.641 kg)  LMP 04/04/2010  Breastfeeding? Unknown Wt Readings from Last 1 Encounters:  11/21/11 186 lb 9.6 oz (84.641 kg)   BMI: There is no height on file to calculate BMI.  General: alert, cooperative and no distress Respiratory: clear to auscultation bilaterally Cardiovascular: regular rate and rhythm, S1, S2 normal, no murmur Breasts:  No dominant masses, nipples erect Gastrointestinal: soft, non-tender; no masses,  no organomegaly Extremities: extremities normal, no pain or edema Vaginal Bleeding: None  EXTERNAL GENITALIA: normal appearing vulva with no masses, tenderness or lesions VAGINA: no abnormal discharge or lesions CERVIX: no lesions or cervical motion tenderness; cervix closed, long, firm UTERUS: gravid and consistent with 13 weeks ADNEXA: no masses palpable and nontender OB EXAM PELVIMETRY: borderline, with prominent pubic arch   FHR:  150   bpm  Assessment:    Pregnancy at  13 1/7 weeks Previous C/S x 2 Hx pyelonephritis Plan:     Prenatal panel reviewed and discussed with the patient:  Drawn today, with Hgb A1C Pap smear collected:  yes GC/Chlamydia collected:  yes Wet prep:  Negative Discussion of Genetic testing options: Wants Quad screen at [place] Prenatal vitamins recommended Problem list reviewed and updated.  Plan of care: Next visit:  5 weeks for ROB and anatomy US Other anticipated f/u:    Rhophylac at 28 weeks  Plan repeat C/S at 39 weeks   Nigel Bridgeman, CNM, [place]

## 2011-11-21 NOTE — Progress Notes (Signed)
Pt is here for her NOB work up . Pt stated no issues today. Last pap was wnl 08/03/10.

## 2011-11-22 LAB — PRENATAL PANEL VII
Antibody Screen: NEGATIVE
Basophils Absolute: 0 10*3/uL (ref 0.0–0.1)
Eosinophils Relative: 0 % (ref 0–5)
HIV: NONREACTIVE
Lymphocytes Relative: 25 % (ref 12–46)
Lymphs Abs: 1.8 10*3/uL (ref 0.7–4.0)
MCV: 89.2 fL (ref 78.0–100.0)
Monocytes Absolute: 0.4 10*3/uL (ref 0.1–1.0)
Monocytes Relative: 6 % (ref 3–12)
Neutro Abs: 5 10*3/uL (ref 1.7–7.7)
Rh Type: NEGATIVE
Rubella: 43.4 IU/mL — ABNORMAL HIGH

## 2011-11-22 LAB — HEMOGLOBIN A1C: Hgb A1c MFr Bld: 5.4 % (ref <5.7)

## 2011-11-26 LAB — PAP IG, CT-NG, RFX HPV ASCU: Chlamydia Probe Amp: NEGATIVE

## 2011-12-10 ENCOUNTER — Telehealth: Payer: Self-pay | Admitting: Obstetrics and Gynecology

## 2011-12-10 NOTE — Telephone Encounter (Signed)
TC to pt. States x 20 min has had sharp pains in center of lower back that comes and goes.  Is standing most of the day and is relieved when she  Changes position.  No UTI sx. Informed may may due to standing and baby growing.  Suggested heat, Tylenol/Ibuprofen, rest. To call if no improvement or other concerns. Pt verbalizes comprehension. Information on back pain in pregnancy mailed to pt.

## 2011-12-26 ENCOUNTER — Ambulatory Visit (INDEPENDENT_AMBULATORY_CARE_PROVIDER_SITE_OTHER): Payer: Medicaid Other

## 2011-12-26 ENCOUNTER — Ambulatory Visit (INDEPENDENT_AMBULATORY_CARE_PROVIDER_SITE_OTHER): Payer: Medicaid Other | Admitting: Obstetrics and Gynecology

## 2011-12-26 ENCOUNTER — Encounter: Payer: Self-pay | Admitting: Obstetrics and Gynecology

## 2011-12-26 VITALS — BP 104/64 | Wt 192.0 lb

## 2011-12-26 DIAGNOSIS — Z331 Pregnant state, incidental: Secondary | ICD-10-CM

## 2011-12-26 DIAGNOSIS — Z3689 Encounter for other specified antenatal screening: Secondary | ICD-10-CM

## 2011-12-26 NOTE — Progress Notes (Signed)
[redacted]w[redacted]d [place] s=d cx 4.03cm Breech pres Anterior placenta with 3 VC FLPK 4.7 cm Normal anatomy Quad screen today Pt plans on repeat c/s and IUD or nexplanon as BC Urine cx not done at new a OB will do today

## 2011-12-26 NOTE — Progress Notes (Signed)
Pt stated no issues today.  

## 2011-12-27 LAB — AFP, QUAD SCREEN
AFP: 38.3 IU/mL
Curr Gest Age: 18.1 wks.days
Down Syndrome Scr Risk Est: 1:15400 {titer}
HCG, Total: 23250 m[IU]/mL
Interpretation-AFP: NEGATIVE
Open Spina bifida: NEGATIVE
Osb Risk: 1:7800 {titer}
uE3 Mom: 1.32
uE3 Value: 1.1 ng/mL

## 2011-12-27 LAB — US OB COMP + 14 WK

## 2011-12-30 ENCOUNTER — Telehealth: Payer: Self-pay | Admitting: Obstetrics and Gynecology

## 2011-12-30 NOTE — Telephone Encounter (Signed)
Pt's husband called and states pt passed out while at work today.  Happened over a hour ago.  Pt drank OJ and felt better, pt did not eat any breakfast this am and works @ [employer] near [employer].  Tc to pt, states that she did not fall on her stomach and denies any vaginal bleeding.  Pt advised to make sure to eat breakfast in the am and to eat small frequent meals throughout the day, to drink plenty of fluids throughout the day, especially water and to call with any further concerns, pt vopices agreement.

## 2012-01-22 ENCOUNTER — Ambulatory Visit (INDEPENDENT_AMBULATORY_CARE_PROVIDER_SITE_OTHER): Payer: Medicaid Other | Admitting: Obstetrics and Gynecology

## 2012-01-22 VITALS — BP 110/60 | Wt 194.0 lb

## 2012-01-22 DIAGNOSIS — Z331 Pregnant state, incidental: Secondary | ICD-10-CM

## 2012-01-22 DIAGNOSIS — Z98891 History of uterine scar from previous surgery: Secondary | ICD-10-CM

## 2012-01-22 DIAGNOSIS — Z9889 Other specified postprocedural states: Secondary | ICD-10-CM

## 2012-01-22 NOTE — Progress Notes (Signed)
[redacted]w[redacted]d Pt has no complaints today. Doing well.

## 2012-01-22 NOTE — Progress Notes (Signed)
[redacted]w[redacted]d No complaints Doing well RTO 4wks  Glucola at [facility] Please discuss signing consent for repeat c/s at [facility]

## 2012-02-19 ENCOUNTER — Encounter: Payer: Self-pay | Admitting: Obstetrics and Gynecology

## 2012-02-19 ENCOUNTER — Other Ambulatory Visit: Payer: Medicaid Other

## 2012-02-19 ENCOUNTER — Ambulatory Visit: Payer: Medicaid Other | Admitting: Obstetrics and Gynecology

## 2012-02-19 VITALS — BP 120/80 | Wt 199.0 lb

## 2012-02-19 DIAGNOSIS — Z331 Pregnant state, incidental: Secondary | ICD-10-CM

## 2012-02-19 LAB — HEMOGLOBIN: Hemoglobin: 10.4 g/dL — ABNORMAL LOW (ref 12.0–15.0)

## 2012-02-19 NOTE — Progress Notes (Signed)
[redacted]w[redacted]d A/P Glucola, hemoglobin and RPR today Fetal kick counts reviewed All patients questions answered Return in two weeks Continue Prenatal vitamins Blood type a negative will send for rhophylac Pt desires repeat cesarean.  Will schedule.  VBAC consent signed today

## 2012-02-19 NOTE — Patient Instructions (Signed)
RhoGAM  When you are pregnant, you will have a blood test to find out what blood type you are. When a pregnant women is Rh-negative, it means that she does not have the Rh factor or antigen (a specific protein in red blood cells). The father of the baby will also be tested for his blood type. If he is Rh-negative also, the baby will be Rh-negative. In this case, no Rh problems will develop during the pregnancy, and RhoGAM will not be needed. If the father is Rh-positive, it is possible that the baby will be Rh-positive, which is incompatible with the mother's Rh-negative blood. In this case, the mother's blood will react as though she is allergic to the baby's blood. Her body will produce antibodies to destroy the baby's red blood cells. This causes anemia, brain damage, and even death of the baby.  RhoGAM (anti-Rh, anti-D immunoglobulin) is a vaccine or immunization produced from human plasma. It is given to Rh-negative women who have Rh-positive babies, to protect the babies of future pregnancies. Firstborn infants are usually not affected, because it takes time for the mother's body to develop the antibodies. About 15% of people are Rh-negative.  CAUSES   In a normal pregnancy, small numbers of the baby's red blood cells get into the mother's bloodstream. When the baby is Rh-positive, the mother's immune system (body system that fights infections and illness) recognizes that the blood is different from her own. The mother's body tries to fight it off and destroy it, like fighting off an infection or illness. The mother's immune system forms antibodies against the baby's blood. These antibodies are passed through the placenta to the baby, and they begin to destroy the baby's red blood cells. The baby becomes anemic (lacking enough red blood cells). The Rh problem does not usually affect the first baby. If the mother does not get RhoGAM, each of the following pregnancies gets more dangerous, if the future babies  are Rh-positive. That is because the mother's immune system develops more antibodies each time. It gets stronger and increases the number of antibodies against the baby's red blood cells with each future pregnancy.  TREATMENT   RhoGAM is a solution containing small amounts of Rh antibodies. It prevents the development of antibodies against Rh-positive blood. It is injected into the mother at around 7 months of pregnancy, if she is Rh-negative. It is injected again within 72 hours after the baby is born, if the baby is Rh-positive. It is also given anytime during the pregnancy, if uterine bleeding develops. RhoGAM does not hurt the baby. It has few and minor side effects, if any, to the mother. A MICRhoGAM (smaller dose) is given in case of a miscarriage, elective abortion, or tubal pregnancy (pregnancy outside the uterus) if it is before 13 weeks of the pregnancy.  RhoGAM should be given in the event of:  · Miscarriage.  · Threatened miscarriage, if there is bleeding.  · Induced abortion.  · Tubal (ectopic) pregnancy.  · Any bleeding during the pregnancy.  · Taking an amniotic fluid sample (amniocentesis).  · Blood sampling.  · Getting the wrong blood transfusion (positive blood in an Rh-negative woman).  · Moving the baby from breech to normal position (external version).  · Taking a placenta tissue sample (chorionic villus sampling).  · Taking a blood sample from the umbilical cord (cordocentesis).  · Mass of cysts in the uterus, instead of a fetus (hydatidiform mole).  · Failure to get RhoGAM when necessary.  ·   Pregnancy lasting past the due date, when the last RhoGAM shot was given 12 weeks ago.  · Injury (trauma) to the abdomen.  RhoGAM should be given even if the Rh-negative mother has a tubal ligation (female sterilization, "tied tubes"). This is because some women will later decide to have surgery to re-open the tubes, in order to get pregnant again. Or the tubal ligation might not work.  RhoGAM is given  after the delivery of an Rh-positive baby to an Rh-negative mother. It only protects the baby in the next pregnancy. RhoGAM must be given after each delivery of an Rh-positive baby born to an Rh-negative mother. RhoGAM cannot help a pregnant Rh-negative mother if she has already been sensitized with Rh-positive antibodies.  RhoGAM is not known to transmit hepatitis or other infectious diseases. Your caregiver can discuss the recommendations and uses of RhoGAM with you. You should not receive RhoGAM if you had an allergic reaction to it in the past.  Document Released: 06/22/2001 Document Revised: 03/25/2011 Document Reviewed: 01/10/2009  ExitCare® Patient Information ©2013 [employer].

## 2012-02-19 NOTE — Progress Notes (Signed)
glucola due @10 :40

## 2012-02-20 LAB — GLUCOSE TOLERANCE, 1 HOUR (50G) W/O FASTING: Glucose, 1 Hour GTT: 129 mg/dL (ref 70–140)

## 2012-03-02 ENCOUNTER — Telehealth: Payer: Self-pay | Admitting: Obstetrics and Gynecology

## 2012-03-02 NOTE — Telephone Encounter (Signed)
TC to pt. Has questions about Rhogam appt. Advised does not need appt. To go to MAU. +FM. To discuss same issue of lower back p[ain at [place] if desires therapy. Pt agreeable.

## 2012-03-04 ENCOUNTER — Inpatient Hospital Stay (HOSPITAL_COMMUNITY)
Admission: AD | Admit: 2012-03-04 | Discharge: 2012-03-04 | Disposition: A | Discharge status: Home / Self Care | Payer: Medicaid Other | Source: Ambulatory Visit | Attending: Obstetrics and Gynecology | Admitting: Obstetrics and Gynecology

## 2012-03-04 DIAGNOSIS — Z2989 Encounter for other specified prophylactic measures: Secondary | ICD-10-CM | POA: Insufficient documentation

## 2012-03-04 DIAGNOSIS — Z298 Encounter for other specified prophylactic measures: Secondary | ICD-10-CM | POA: Insufficient documentation

## 2012-03-04 LAB — ABO/RH: ABO/RH(D): A NEG

## 2012-03-04 MED ORDER — RHO D IMMUNE GLOBULIN 1500 UNIT/2ML IJ SOLN
300.0000 ug | Freq: Once | INTRAMUSCULAR | Status: AC
  Administered 2012-03-04: 300 ug via INTRAMUSCULAR
  Filled 2012-03-04: qty 2

## 2012-03-04 NOTE — MAU Note (Signed)
Pt has received previously, no complications.  Understands why she needs it.  Declined rhopyllac info.  Nkda. Time associated with bloodwork and injection discussed.

## 2012-03-05 ENCOUNTER — Ambulatory Visit: Payer: Medicaid Other | Admitting: Obstetrics and Gynecology

## 2012-03-05 ENCOUNTER — Telehealth: Payer: Self-pay | Admitting: Obstetrics and Gynecology

## 2012-03-05 VITALS — BP 110/62 | Wt 200.0 lb

## 2012-03-05 DIAGNOSIS — Z98891 History of uterine scar from previous surgery: Secondary | ICD-10-CM

## 2012-03-05 LAB — RH IG WORKUP (INCLUDES ABO/RH)
ABO/RH(D): A NEG
Antibody Screen: NEGATIVE
Gestational Age(Wks): 28

## 2012-03-05 NOTE — Telephone Encounter (Signed)
Tc to pt regarding msg per AR.  Pt states that she had already gone to MAU yesterday and got the Rhogam shot, informed will make AR aware.

## 2012-03-05 NOTE — Telephone Encounter (Signed)
Message copied by Delon [place] on Thu Mar 05, 2012  3:03 PM ------      Message from: Osborn Coho      Created: Thu Mar 05, 2012 12:37 PM       Please notify pt to go to hosp for rhogam.  Will enter orders ------

## 2012-03-05 NOTE — Progress Notes (Signed)
[redacted]w[redacted]d Glucola :129 Hemoglobin:10.4   RPR:Non Reactive C/O: Pain in lower back on L side practically in buttock  that is getting worse and shoots down left leg and is positional Refer to PT for sciatica FKCs RTO 2wks Rhogam at [facility] ordered

## 2012-03-19 ENCOUNTER — Encounter: Payer: Medicaid Other | Admitting: Family Medicine

## 2012-03-19 ENCOUNTER — Ambulatory Visit: Payer: Medicaid Other | Admitting: Family Medicine

## 2012-03-19 VITALS — BP 104/62 | Wt 204.0 lb

## 2012-03-19 DIAGNOSIS — Z331 Pregnant state, incidental: Secondary | ICD-10-CM

## 2012-03-19 DIAGNOSIS — O360130 Maternal care for anti-D [Rh] antibodies, third trimester, not applicable or unspecified: Secondary | ICD-10-CM

## 2012-03-19 NOTE — Progress Notes (Signed)
[redacted]w[redacted]d Doing better with sciatic pain, good FM. Would like to have repeat C/S without tubal. ROB in 2 weeks. L.Carter, FNP-BC

## 2012-03-19 NOTE — Assessment & Plan Note (Signed)
Pt has received Rhogam (02/2012)

## 2012-03-19 NOTE — Progress Notes (Signed)
[redacted]w[redacted]d No complaints today.

## 2012-04-02 ENCOUNTER — Ambulatory Visit: Payer: Medicaid Other | Admitting: Obstetrics and Gynecology

## 2012-04-02 ENCOUNTER — Encounter: Payer: Self-pay | Admitting: Obstetrics and Gynecology

## 2012-04-02 VITALS — BP 108/64 | Wt 201.0 lb

## 2012-04-02 DIAGNOSIS — Z331 Pregnant state, incidental: Secondary | ICD-10-CM

## 2012-04-02 NOTE — Progress Notes (Signed)
[redacted]w[redacted]d Pt without c/o will schedule cesarean

## 2012-04-02 NOTE — Patient Instructions (Signed)
Cesarean Delivery  Cesarean delivery is the birth of a baby through a cut (incision) in the abdomen and womb (uterus).  LET YOUR CAREGIVER KNOW ABOUT:  Complicationsinvolving the pregnancy.  Allergies.  Medicines taken including herbs, eyedrops, over-the-counter medicines, and creams.  Use of steroids (by mouth or creams).  Previous problems with anesthetics or numbing medicine.  Previous surgery.  History of blood clots.  History of bleeding or blood problems.  Other health problems. RISKS AND COMPLICATIONS   Bleeding.  Infection.  Blood clots.  Injury to surrounding organs.  Anesthesia problems.  Injury to the baby. BEFORE THE PROCEDURE   A tube (Foley catheter) will be placed in your bladder. The Foley catheter drains the urine from your bladder into a bag. This keeps your bladder empty during surgery.  An intravenous access tube (IV) will be placed in your arm.  Hair may be removed from your pubic area and your lower abdomen. This is to prevent infection in the incision site.  You may be given an antacid medicine to drink. This will prevent acid contents in your stomach from going into your lungs if you vomit during the surgery.  You may be given an antibiotic medicine to prevent infection. PROCEDURE   You may be given medicine to numb the lower half of your body (regional anesthetic). If you were in labor, you may have already had an epidural in place which can be used in both labor and cesarean delivery. You may possibly be given medicine to make you sleep (general anesthetic) though this is not as common.  An incision will be made in your abdomen that extends to your uterus. There are 2 basic kinds of incisions:  The horizontal (transverse) incision. Horizontal incisions are used for most routine cesarean deliveries.  The vertical (up and down) incision. This is less commonly used. This is most often reserved for women who have a serious complication  (extreme prematurity) or under emergency situations.  The horizontal and vertical incisions may both be used at the same time. However, this is very uncommon.  Your baby will then be delivered. AFTER THE PROCEDURE   If you were awake during the surgery, you will see your baby right away. If you were asleep, you will see your baby as soon as you are awake.  You may breastfeed your baby after surgery.  You may be able to get up and walk the same day as the surgery. If you need to stay in bed for a period of time, you will receive help to turn, cough, and take deep breaths after surgery. This helps prevent lung problems such as pneumonia.  Do not get out of bed alone the first time after surgery. You will need help getting out of bed until you are able to do this by yourself.  You may be able to shower the day after your cesarean delivery. After the bandage (dressing) is taken off the incision site, a nurse will assist you to shower, if you like.  You will have pneumatic compressing hose placed on your feet or lower legs. These hose are used to prevent blood clots. When you are up and walking regularly, they will no longer be necessary.  Do not cross your legs when you sit.  Save any blood clots that you pass. If you pass a clot while on the toilet, do not flush it. Call for the nurse. Tell the nurse if you think you are bleeding too much or passing too many   clots.  Start drinking liquids and eating food as directed by your caregiver. If your stomach is not ready, drinking and eating too soon can cause an increase in bloating and swelling of your intestine and abdomen. This is very uncomfortable.  You will be given medicine as needed. Let your caregivers know if you are hurting. They want you to be comfortable. You may also be given an antibiotic to prevent an infection.  Your IV will be taken out when you are drinking a reasonable amount of fluids. The Foley catheter is taken out when  you are up and walking.  If your blood type is Rh negative and your baby's blood type is Rh positive, you will be given a shot of anti-D immune globulin. This shot prevents you from having Rh problems with a future pregnancy. You should get the shot even if you had your tubes tied (tubal ligation).  If you are allowed to take the baby for a walk, place the baby in the bassinet and push it. Do not carry your baby in your arms. Document Released: 12/31/2004 Document Revised: 03/25/2011 Document Reviewed: 04/27/2010 ExitCare Patient Information 2013 [employer].  

## 2012-04-24 ENCOUNTER — Other Ambulatory Visit: Payer: Self-pay | Admitting: Obstetrics and Gynecology

## 2012-04-30 ENCOUNTER — Encounter (HOSPITAL_COMMUNITY): Payer: Medicaid Other

## 2012-05-02 ENCOUNTER — Encounter (HOSPITAL_COMMUNITY): Payer: Self-pay | Admitting: *Deleted

## 2012-05-02 ENCOUNTER — Inpatient Hospital Stay (HOSPITAL_COMMUNITY)
Admission: AD | Admit: 2012-05-02 | Discharge: 2012-05-02 | Disposition: A | Discharge status: Home / Self Care | Payer: Medicaid Other | Source: Ambulatory Visit | Attending: Obstetrics and Gynecology | Admitting: Obstetrics and Gynecology

## 2012-05-02 DIAGNOSIS — N12 Tubulo-interstitial nephritis, not specified as acute or chronic: Secondary | ICD-10-CM

## 2012-05-02 DIAGNOSIS — O034 Incomplete spontaneous abortion without complication: Secondary | ICD-10-CM

## 2012-05-02 DIAGNOSIS — R109 Unspecified abdominal pain: Secondary | ICD-10-CM | POA: Insufficient documentation

## 2012-05-02 DIAGNOSIS — O99891 Other specified diseases and conditions complicating pregnancy: Secondary | ICD-10-CM | POA: Insufficient documentation

## 2012-05-02 DIAGNOSIS — Z98891 History of uterine scar from previous surgery: Secondary | ICD-10-CM

## 2012-05-02 LAB — CBC WITH DIFFERENTIAL/PLATELET
Basophils Relative: 0 % (ref 0–1)
HCT: 29.9 % — ABNORMAL LOW (ref 36.0–46.0)
Hemoglobin: 10.1 g/dL — ABNORMAL LOW (ref 12.0–15.0)
Lymphocytes Relative: 33 % (ref 12–46)
Lymphs Abs: 2.9 10*3/uL (ref 0.7–4.0)
Monocytes Relative: 9 % (ref 3–12)
Neutro Abs: 5.1 10*3/uL (ref 1.7–7.7)
Neutrophils Relative %: 58 % (ref 43–77)
RBC: 3.36 MIL/uL — ABNORMAL LOW (ref 3.87–5.11)
WBC: 8.8 10*3/uL (ref 4.0–10.5)

## 2012-05-02 LAB — URIC ACID: Uric Acid, Serum: 5.9 mg/dL (ref 2.4–7.0)

## 2012-05-02 LAB — URINALYSIS, ROUTINE W REFLEX MICROSCOPIC
Bilirubin Urine: NEGATIVE
Hgb urine dipstick: NEGATIVE
Ketones, ur: NEGATIVE mg/dL
Specific Gravity, Urine: <1.005 — ABNORMAL LOW (ref 1.005–1.030)
Urobilinogen, UA: 0.2 mg/dL (ref 0.0–1.0)

## 2012-05-02 LAB — COMPREHENSIVE METABOLIC PANEL
ALT: 15 U/L (ref 0–35)
Alkaline Phosphatase: 113 U/L (ref 39–117)
CO2: 22 mEq/L (ref 19–32)
Chloride: 103 mEq/L (ref 96–112)
GFR calc Af Amer: >90 mL/min (ref >90)
Glucose, Bld: 104 mg/dL — ABNORMAL HIGH (ref 70–99)
Potassium: 3.3 mEq/L — ABNORMAL LOW (ref 3.5–5.1)
Sodium: 136 mEq/L (ref 135–145)
Total Bilirubin: 0.1 mg/dL — ABNORMAL LOW (ref 0.3–1.2)
Total Protein: 5.4 g/dL — ABNORMAL LOW (ref 6.0–8.3)

## 2012-05-02 LAB — WET PREP, GENITAL: Yeast Wet Prep HPF POC: NONE SEEN

## 2012-05-02 MED ORDER — HYDROCODONE-ACETAMINOPHEN 5-325 MG PO TABS: 1.0000 | ORAL_TABLET | ORAL | Status: DC | PRN

## 2012-05-02 MED ORDER — HYDROCODONE-ACETAMINOPHEN 5-325 MG PO TABS
1.0000 | ORAL_TABLET | Freq: Once | ORAL | Status: AC
  Administered 2012-05-02: 1 via ORAL
  Filled 2012-05-02: qty 1

## 2012-05-02 NOTE — MAU Note (Addendum)
PT SAYS SHE IS HAVING  UPPER ABD PAIN - - STARTED  ON Friday BUT THIS AFTERNOON AT 1 PM - SHE FELT A SHARP PAIN IN HER UPPER ABD- GOES ACROSS TOP OF ABD. - THAT COMES/ GOES .      DID NOT CALL DR.    PT LIVES IN [place]- BUT WAS IN [place] - SO AFTERNOON  AFTER OUT TO EAT- CAME OVER TO GET THIS PAIN  EVALUATED      ALSO SAYS SHE HAS A LITTLE BULDGE IN VAGINA- PATS DRY- HURTS TO WIPE-  STARTED   TONIGHT- BUT IT HAS BEEN SENSITIVE  X3 WEEKS.   LAST APPOINTMENT - THIS WEEK- SHE MISSED IT-  WAS SEEN   3 WEEKS AGO.     NO VE IN OFFICE.    DENIES HSV AND MRSA...  [facility] FOR REPEAT C/S  ON 5-7.

## 2012-05-02 NOTE — MAU Note (Signed)
Pain in upper abdomen for last week but really felt it today. Brought tears to my eyes. Vaginal bulge for couple weeks.

## 2012-05-02 NOTE — MAU Provider Note (Signed)
History   27 yo GP2022 at 71 3/7 weeks presented unannounced c/o bilateral upper abdominal pain today--denies contractions, N/V, diarrhea or constipation, bleeding, d/c, or urinary sx.  Reports +FM.  Also c/o "bulge in vagina" today, but has been sensitive for several days.  Has also had pain in both sides of abdomen last few weeks and has had reflux during pregnancy.  Scheduled for repeat C/S on 5/7.  Missed ROB appt this past week, last seen at 34 weeks.  Has visit scheduled on Thursday of the upcoming week.  Patient Active Problem List  Diagnosis  . Rh negative, maternal  . History of cesarean delivery x 2  . TAB X 2  . Hx pyelonephritis     Chief Complaint  Patient presents with  . Abdominal Pain     OB History   Grav Para Term Preterm Abortions TAB SAB Ect Mult Living   5 2 2  2 2    2       Past Medical History  Diagnosis Date  . No pertinent past medical history   . Complication of anesthesia     high BP in PACU- resolved in PACU  . Cystitis 2012  . Pyelonephritis 2012  . Abortion, elective or therapeutic 2011 and 2012  . Hypertension, postpartum condition or complication     Past Surgical History  Procedure Laterality Date  . Cesarean section  12/2006    baby's head position  . Cesarean section  01/15/2011    Procedure: CESAREAN SECTION;  Surgeon: Michael Litter, MD;  Location: [facility];  Service: Gynecology;  Laterality: N/A;    Family History  Problem Relation Age of Onset  . Migraines Mother   . Drug abuse Mother   . Asthma Brother   . Diabetes Paternal Aunt   . Drug abuse Paternal Aunt   . Cancer Maternal Grandmother     breast and esophageal  . Heart attack Paternal Grandmother   . Hypertension Paternal Grandmother   . Diabetes Paternal Grandmother   . Hypertension Paternal Grandfather     History  Substance Use Topics  . Smoking status: Never Smoker   . Smokeless tobacco: Not on file  . Alcohol Use: No    Allergies: No Known  Allergies  Prescriptions prior to admission  Medication Sig Dispense Refill  . calcium carbonate (TUMS - DOSED IN MG ELEMENTAL CALCIUM) 500 MG chewable tablet Chew 2 tablets by mouth daily as needed for heartburn.      . prenatal vitamin w/FE, FA (PRENATAL 1 + 1) 27-1 MG TABS Take 1 tablet by mouth daily.           Physical Exam   Blood pressure 135/73, pulse 103, temperature 97.7 F (36.5 C), resp. rate 20, height 5\' 1"  (1.549 m), weight 204 lb (92.534 kg), SpO2 100.00%, unknown if currently breastfeeding.  In NAD Chest clear Heart RRR without murmur Abd gravid, soft, mild tenderness in right side of abdomen, but no rebound or guarding. Negative CVAT Pelvic--cervix closed, long, vtx, -1.  Small amount white d/c in vault. Ext WNL  FHR Category 1 UCs none    ED Course  IUP at 36 3/7 weeks Upper abdominal pain  Plan: CBC, diff, CMP, uric acid, amylase, lipase UA GBS, GC, chlamydia, wet prep.   Nigel Bridgeman CNM, [place] 05/02/2012 11:11 PM  Addendum:  Results for orders placed during the hospital encounter of 05/02/12 (from the past 24 hour(s))  URINALYSIS, ROUTINE W REFLEX MICROSCOPIC  Status: Abnormal   Collection Time    05/02/12  9:00 PM      Result Value Range   Color, Urine YELLOW  YELLOW   APPearance CLEAR  CLEAR   Specific Gravity, Urine <1.005 (*) 1.005 - 1.030   pH 6.0  5.0 - 8.0   Glucose, UA NEGATIVE  NEGATIVE mg/dL   Hgb urine dipstick NEGATIVE  NEGATIVE   Bilirubin Urine NEGATIVE  NEGATIVE   Ketones, ur NEGATIVE  NEGATIVE mg/dL   Protein, ur NEGATIVE  NEGATIVE mg/dL   Urobilinogen, UA 0.2  0.0 - 1.0 mg/dL   Nitrite NEGATIVE  NEGATIVE   Leukocytes, UA NEGATIVE  NEGATIVE  CBC WITH DIFFERENTIAL     Status: Abnormal   Collection Time    05/02/12  9:54 PM      Result Value Range   WBC 8.8  4.0 - 10.5 K/uL   RBC 3.36 (*) 3.87 - 5.11 MIL/uL   Hemoglobin 10.1 (*) 12.0 - 15.0 g/dL   HCT 08.6 (*) 57.8 - 46.9 %   MCV 89.0  78.0 - 100.0 fL   MCH 30.1   26.0 - 34.0 pg   MCHC 33.8  30.0 - 36.0 g/dL   RDW 62.9  52.8 - 41.3 %   Platelets 207  150 - 400 K/uL   Neutrophils Relative 58  43 - 77 %   Neutro Abs 5.1  1.7 - 7.7 K/uL   Lymphocytes Relative 33  12 - 46 %   Lymphs Abs 2.9  0.7 - 4.0 K/uL   Monocytes Relative 9  3 - 12 %   Monocytes Absolute 0.8  0.1 - 1.0 K/uL   Eosinophils Relative 0  0 - 5 %   Eosinophils Absolute 0.0  0.0 - 0.7 K/uL   Basophils Relative 0  0 - 1 %   Basophils Absolute 0.0  0.0 - 0.1 K/uL  COMPREHENSIVE METABOLIC PANEL     Status: Abnormal   Collection Time    05/02/12  9:54 PM      Result Value Range   Sodium 136  135 - 145 mEq/L   Potassium 3.3 (*) 3.5 - 5.1 mEq/L   Chloride 103  96 - 112 mEq/L   CO2 22  19 - 32 mEq/L   Glucose, Bld 104 (*) 70 - 99 mg/dL   BUN 7  6 - 23 mg/dL   Creatinine, Ser 2.44  0.50 - 1.10 mg/dL   Calcium 8.6  8.4 - 01.0 mg/dL   Total Protein 5.4 (*) 6.0 - 8.3 g/dL   Albumin 2.4 (*) 3.5 - 5.2 g/dL   AST 18  0 - 37 U/L   ALT 15  0 - 35 U/L   Alkaline Phosphatase 113  39 - 117 U/L   Total Bilirubin 0.1 (*) 0.3 - 1.2 mg/dL   GFR calc non Af Amer >90  >90 mL/min   GFR calc Af Amer >90  >90 mL/min  URIC ACID     Status: None   Collection Time    05/02/12  9:54 PM      Result Value Range   Uric Acid, Serum 5.9  2.4 - 7.0 mg/dL  WET PREP, GENITAL     Status: Abnormal   Collection Time    05/02/12 10:25 PM      Result Value Range   Yeast Wet Prep HPF POC NONE SEEN  NONE SEEN   Trich, Wet Prep NONE SEEN  NONE SEEN   Clue Cells Wet  Prep HPF POC NONE SEEN  NONE SEEN   WBC, Wet Prep HPF POC FEW (*) NONE SEEN   Pain likely from musculoskeletal issues. Vaginal "bulge" c/w pelvic congestion--no evidence of cystocele/rectocele, etc. Comfort measures reviewed. Rx Vicodin 5/325 1 po q 4-6 hour prn pain. F/U as scheduled for ROB this week, or prn.  Nigel Bridgeman, CNM 05/02/12 10:45pm,

## 2012-05-05 LAB — CULTURE, BETA STREP (GROUP B ONLY)

## 2012-05-07 ENCOUNTER — Encounter (HOSPITAL_COMMUNITY): Payer: Self-pay | Admitting: Pharmacist

## 2012-05-10 ENCOUNTER — Inpatient Hospital Stay (HOSPITAL_COMMUNITY)
Admission: AD | Admit: 2012-05-10 | Discharge: 2012-05-10 | Disposition: A | Discharge status: Home / Self Care | Payer: Medicaid Other | Source: Ambulatory Visit | Attending: Obstetrics and Gynecology | Admitting: Obstetrics and Gynecology

## 2012-05-10 ENCOUNTER — Encounter (HOSPITAL_COMMUNITY): Payer: Self-pay | Admitting: *Deleted

## 2012-05-10 DIAGNOSIS — N12 Tubulo-interstitial nephritis, not specified as acute or chronic: Secondary | ICD-10-CM

## 2012-05-10 DIAGNOSIS — O34219 Maternal care for unspecified type scar from previous cesarean delivery: Secondary | ICD-10-CM | POA: Insufficient documentation

## 2012-05-10 DIAGNOSIS — O034 Incomplete spontaneous abortion without complication: Secondary | ICD-10-CM

## 2012-05-10 DIAGNOSIS — O479 False labor, unspecified: Secondary | ICD-10-CM | POA: Insufficient documentation

## 2012-05-10 DIAGNOSIS — Z98891 History of uterine scar from previous surgery: Secondary | ICD-10-CM

## 2012-05-10 LAB — OB RESULTS CONSOLE GBS: GBS: NEGATIVE

## 2012-05-10 MED ORDER — ZOLPIDEM TARTRATE 10 MG PO TABS: 10.0000 mg | ORAL_TABLET | Freq: Every evening | ORAL | Status: DC | PRN

## 2012-05-10 NOTE — MAU Note (Signed)
Contractions since Friday but stronger since 0500. No leaking or bleeding

## 2012-05-10 NOTE — MAU Provider Note (Signed)
History   CSN: [id number]  Arrival date and time: 05/10/12 1478   First Provider Initiated Contact with Patient 05/10/12 0636      Chief Complaint  Patient presents with  . Contractions   HPI Pt presents to MAU at 37w 4d with c/o of painful regular uterine contractions since 0400 05/10/12.  Denies ROM or bldg.  Reports normal fetal activity.  Pt is sched for elective repeat C/S on 05/20/12.  Last solid food 2130 05/10/12.  States last SVE 1cm/70% this past week at [employer] office. Currently denies any use of hydrocodone which was prescribed due to sciatica.   OB History   Grav Para Term Preterm Abortions TAB SAB Ect Mult Living   5 2 2  2 2    2       Past Medical History  Diagnosis Date  . No pertinent past medical history   . Complication of anesthesia     high BP in PACU- resolved in PACU  . Cystitis 2012  . Pyelonephritis 2012  . Abortion, elective or therapeutic 2011 and 2012  . Hypertension, postpartum condition or complication     Past Surgical History  Procedure Laterality Date  . Cesarean section  12/2006    baby's head position  . Cesarean section  01/15/2011    Procedure: CESAREAN SECTION;  Surgeon: Michael Litter, MD;  Location: [facility];  Service: Gynecology;  Laterality: N/A;    Family History  Problem Relation Age of Onset  . Migraines Mother   . Drug abuse Mother   . Asthma Brother   . Diabetes Paternal Aunt   . Drug abuse Paternal Aunt   . Cancer Maternal Grandmother     breast and esophageal  . Heart attack Paternal Grandmother   . Hypertension Paternal Grandmother   . Diabetes Paternal Grandmother   . Hypertension Paternal Grandfather     History  Substance Use Topics  . Smoking status: Never Smoker   . Smokeless tobacco: Not on file  . Alcohol Use: No    Allergies: No Known Allergies  Prescriptions prior to admission  Medication Sig Dispense Refill  . HYDROcodone-acetaminophen (NORCO/VICODIN) 5-325 MG per tablet Take 1 tablet by mouth every 4  (four) hours as needed for pain.  30 tablet  0  . prenatal vitamin w/FE, FA (PRENATAL 1 + 1) 27-1 MG TABS Take 1 tablet by mouth daily.        . calcium carbonate (TUMS - DOSED IN MG ELEMENTAL CALCIUM) 500 MG chewable tablet Chew 2 tablets by mouth daily as needed for heartburn.        Review of Systems  Constitutional: Negative.   HENT: Negative.   Eyes: Negative.   Respiratory: Negative.   Cardiovascular: Negative.   Gastrointestinal: Negative.   Genitourinary: Negative.   Musculoskeletal: Negative.   Skin: Negative.   Neurological: Negative.   Endo/Heme/Allergies: Negative.   Psychiatric/Behavioral: Negative.    Physical Exam   Blood pressure 130/76, pulse 84, temperature 97.8 F (36.6 C), resp. rate 20, height 5\' 1"  (1.549 m), weight 203 lb (92.08 kg).  Physical Exam  Constitutional: She is oriented to person, place, and time. She appears well-developed and well-nourished.  HENT:  Head: Normocephalic and atraumatic.  Right Ear: External ear normal.  Left Ear: External ear normal.  Nose: Nose normal.  Eyes: EOM are normal. Pupils are equal, round, and reactive to light.  Neck: Normal range of motion. Neck supple.  Cardiovascular: Normal rate, regular rhythm and intact distal pulses.  Respiratory: Effort normal and breath sounds normal.  GI: Soft. Bowel sounds are normal. There is no tenderness. There is no rebound and no guarding.  Genitourinary: Uterus normal.  Gravid uterus, soft between contractions. SVE 1cm/50%/-3/vtx/soft/posterior. Ext gent WNL.  BUS neg.  Spec exam deferred.  Musculoskeletal: Normal range of motion. She exhibits edema.  Tr edema noted bilat lower extrems.  Neurological: She is alert and oriented to person, place, and time. She has normal reflexes.  Skin: Skin is warm and dry.  Psychiatric: She has a normal mood and affect. Her behavior is normal. Thought content normal.   FHR baseline 140bpm Variability: moderate Accels:  Present Decels:  Absent FHR Cat 1 UCs every 2-5 mins, mild to mod to palpation.  MAU Course  Procedures   Assessment and Plan  IUP at 37w 4d Previous C/S x 2 - plans repeat Uterine contractions  Consult with Dr. Su Hilt.  Will observe x 1 hour then reassess SVE for change.  Pt instructed to remain NPO.   SMITH,NONA O. 05/10/2012, 6:53 AM   Addendum: at 4098  S: some ctx feel less painful , resting eyes closed in dark room upon my arrival   O: FHR cat 1 toco irreg 3-5, palpate mild VE no change, 1/th/-3 posterior  A: IUP at [redacted]w[redacted]d, not in labor  P: discharge home, stable condition, rv'd FKC and labor sx's  RTO routine   S.Pryce Folts, CNM

## 2012-05-18 ENCOUNTER — Encounter (HOSPITAL_COMMUNITY)
Admission: RE | Admit: 2012-05-18 | Discharge: 2012-05-18 | Disposition: A | Discharge status: Home / Self Care | Payer: Medicaid Other | Source: Ambulatory Visit | Attending: Obstetrics and Gynecology | Admitting: Obstetrics and Gynecology

## 2012-05-18 ENCOUNTER — Encounter (HOSPITAL_COMMUNITY): Payer: Self-pay

## 2012-05-18 LAB — CBC
HCT: 33.2 % — ABNORMAL LOW (ref 36.0–46.0)
Hemoglobin: 11.1 g/dL — ABNORMAL LOW (ref 12.0–15.0)
MCH: 29.6 pg (ref 26.0–34.0)
RBC: 3.75 MIL/uL — ABNORMAL LOW (ref 3.87–5.11)

## 2012-05-18 LAB — RPR: RPR Ser Ql: NONREACTIVE

## 2012-05-18 NOTE — Patient Instructions (Addendum)
Your procedure is scheduled on:05/20/12  Enter through the Main Entrance at :8am on WED. Pick up desk phone and dial [phone] and inform us of your arrival.  Please call [phone] if you have any problems the morning of surgery.  Remember: Do not eat or drink after midnight:TUESDAY   DO NOT wear jewelry, eye make-up, lipstick,body lotion, or dark fingernail polish.   If you are to be admitted after surgery, leave suitcase in car until your room has been assigned. Patients discharged on the day of surgery will not be allowed to drive home.

## 2012-05-19 NOTE — H&P (Signed)
Katherine Riggs is a 27 y.o. [phone] at [redacted]w[redacted]d presenting for repeat c/s. 2 previous c/s.   History  History of present pregnancy: Patient entered care at 13 weeks.   EDC of 05/27/12 was established by 13 week U/S.   Anatomy scan:  18 weeks, with normal findings and an anterior placenta.   Additional [place] evaluations:  At 38 weeks for S<D - normal linear growth and fluid.   Significant prenatal events:  none   Last evaluation:  05/13/12 at [redacted]w[redacted]d  OB History   Grav Para Term Preterm Abortions TAB SAB Ect Mult Living   5 2 2  2 2    2      Patient Active Problem List   Diagnosis Date Noted  . TAB X 2 11/21/2011  . Hx pyelonephritis 11/21/2011  . Rh negative, maternal 01/15/2011  . History of cesarean delivery x 2 01/15/2011   Past Medical History  Diagnosis Date  . No pertinent past medical history   . Complication of anesthesia     high BP in PACU- resolved in PACU  . Cystitis 2012  . Pyelonephritis 2012  . Abortion, elective or therapeutic 2011 and 2012  . Hypertension, postpartum condition or complication 2008   Past Surgical History  Procedure Laterality Date  . Cesarean section  12/2006    baby's head position  . Cesarean section  01/15/2011    Procedure: CESAREAN SECTION;  Surgeon: Michael Litter, MD;  Location: [facility];  Service: Gynecology;  Laterality: N/A;   Family History: family history includes Asthma in her brother; Cancer in her maternal grandmother; Diabetes in her paternal aunt and paternal grandmother; Drug abuse in her mother and paternal aunt; Heart attack in her paternal grandmother; Hypertension in her paternal grandfather and paternal grandmother; and Migraines in her mother.  Social History:  reports that she has never smoked. She does not have any smokeless tobacco history on file. She reports that she does not drink alcohol or use illicit drugs.   Prenatal Transfer Tool  Maternal Diabetes: No Genetic Screening: Normal Maternal Ultrasounds/Referrals:  Normal Fetal Ultrasounds or other Referrals:  None Maternal Substance Abuse:  No Significant Maternal Medications:  None Significant Maternal Lab Results:  Lab values include: Group B Strep negative, Rh negative Other Comments:  None  Review of Systems  All other systems reviewed and are negative.   Filed Vitals:   05/20/12 0818  BP: 123/74  Pulse: 98  Temp: 97.7 F (36.5 C)  TempSrc: Oral  Resp: 18  SpO2: 99%    Exam Physical Exam  Constitutional: She is oriented to person, place, and time. She appears well-developed and well-nourished. No distress.  Cardiovascular: Normal rate and regular rhythm.   Respiratory: Effort normal and breath sounds normal.  GI: Bowel sounds are normal. There is no tenderness.  gravid  Neurological: She is oriented to person, place, and time.  Skin: Skin is warm and dry.     Prenatal labs: ABO, Rh: --/--/A NEG (05/05 1130) Antibody: POS (05/05 1130) Rubella: 43.4 (11/07 1455)Immune RPR: NON REACTIVE (05/05 1130)  HBsAg: NEGATIVE (11/07 1455)  HIV: NON REACTIVE (11/07 1455)  GBS: Negative (04/27 0000)  Sickle cell/Hgb electrophoresis:  n/a Pap:  11/21/11 WNL GC:  neg Chlamydia:  neg Genetic screenings:  Neg AFP Glucola:  129 Other:  none  Assessment/Plan: IUP at [redacted]w[redacted]d Repeat C/S Anterior placenta GBS negative A neg  [facility] per consult with Dr. Normand Sloop Routine [facility] pre op orders Patient understands  R/B of c/s, including bleeding, infection, and damage to other organs.  Pt seems to understand these risks and wishes to proceed.   Haroldine Laws 05/19/2012, 6:47 PM  Date of Initial H&P: 05/20/2012  History reviewed, patient examined, no change in status, stable for surgery.

## 2012-05-20 ENCOUNTER — Inpatient Hospital Stay (HOSPITAL_COMMUNITY): Payer: Medicaid Other | Admitting: Anesthesiology

## 2012-05-20 ENCOUNTER — Encounter (HOSPITAL_COMMUNITY): Payer: Self-pay | Admitting: Anesthesiology

## 2012-05-20 ENCOUNTER — Encounter (HOSPITAL_COMMUNITY): Payer: Self-pay | Admitting: *Deleted

## 2012-05-20 ENCOUNTER — Encounter (HOSPITAL_COMMUNITY)
Admission: RE | Disposition: A | Discharge status: Home / Self Care | Payer: Self-pay | Source: Ambulatory Visit | Attending: Obstetrics and Gynecology

## 2012-05-20 ENCOUNTER — Inpatient Hospital Stay (HOSPITAL_COMMUNITY)
Admission: RE | Admit: 2012-05-20 | Discharge: 2012-05-23 | DRG: 766 | Disposition: A | Discharge status: Home / Self Care | Payer: Medicaid Other | Source: Ambulatory Visit | Attending: Obstetrics and Gynecology | Admitting: Obstetrics and Gynecology

## 2012-05-20 DIAGNOSIS — D6489 Other specified anemias: Secondary | ICD-10-CM | POA: Diagnosis not present

## 2012-05-20 DIAGNOSIS — O34219 Maternal care for unspecified type scar from previous cesarean delivery: Principal | ICD-10-CM | POA: Diagnosis present

## 2012-05-20 DIAGNOSIS — N12 Tubulo-interstitial nephritis, not specified as acute or chronic: Secondary | ICD-10-CM

## 2012-05-20 DIAGNOSIS — Z98891 History of uterine scar from previous surgery: Secondary | ICD-10-CM

## 2012-05-20 DIAGNOSIS — O9903 Anemia complicating the puerperium: Secondary | ICD-10-CM | POA: Diagnosis not present

## 2012-05-20 DIAGNOSIS — O034 Incomplete spontaneous abortion without complication: Secondary | ICD-10-CM

## 2012-05-20 HISTORY — DX: History of uterine scar from previous surgery: Z98.891

## 2012-05-20 SURGERY — Surgical Case
Anesthesia: Spinal | Wound class: Clean Contaminated

## 2012-05-20 MED ORDER — PHENYLEPHRINE 40 MCG/ML (10ML) SYRINGE FOR IV PUSH (FOR BLOOD PRESSURE SUPPORT)
PREFILLED_SYRINGE | INTRAVENOUS | Status: AC
  Filled 2012-05-20: qty 5

## 2012-05-20 MED ORDER — ONDANSETRON HCL 4 MG/2ML IJ SOLN
INTRAMUSCULAR | Status: DC | PRN
  Administered 2012-05-20: 4 mg via INTRAVENOUS

## 2012-05-20 MED ORDER — DIPHENHYDRAMINE HCL 50 MG/ML IJ SOLN: 12.5000 mg | INTRAMUSCULAR | Status: DC | PRN

## 2012-05-20 MED ORDER — ONDANSETRON HCL 4 MG/2ML IJ SOLN: 4.0000 mg | INTRAMUSCULAR | Status: DC | PRN

## 2012-05-20 MED ORDER — OXYCODONE-ACETAMINOPHEN 5-325 MG PO TABS
1.0000 | ORAL_TABLET | ORAL | Status: DC | PRN
  Administered 2012-05-21: 1 via ORAL
  Administered 2012-05-21 (×2): 2 via ORAL
  Administered 2012-05-21: 1 via ORAL
  Administered 2012-05-21 – 2012-05-22 (×4): 2 via ORAL
  Administered 2012-05-22: 1 via ORAL
  Administered 2012-05-22: 2 via ORAL
  Administered 2012-05-23: 1 via ORAL
  Filled 2012-05-20: qty 2
  Filled 2012-05-20: qty 1
  Filled 2012-05-20: qty 2
  Filled 2012-05-20: qty 1
  Filled 2012-05-20 (×3): qty 2
  Filled 2012-05-20: qty 1
  Filled 2012-05-20 (×3): qty 2

## 2012-05-20 MED ORDER — MEASLES, MUMPS & RUBELLA VAC ~~LOC~~ INJ
0.5000 mL | INJECTION | Freq: Once | SUBCUTANEOUS | Status: DC
  Filled 2012-05-20: qty 0.5

## 2012-05-20 MED ORDER — FENTANYL CITRATE 0.05 MG/ML IJ SOLN
INTRAMUSCULAR | Status: DC | PRN
  Administered 2012-05-20: 12.5 ug via INTRATHECAL

## 2012-05-20 MED ORDER — IBUPROFEN 600 MG PO TABS
600.0000 mg | ORAL_TABLET | Freq: Four times a day (QID) | ORAL | Status: DC
  Administered 2012-05-21 – 2012-05-23 (×9): 600 mg via ORAL
  Filled 2012-05-20 (×10): qty 1

## 2012-05-20 MED ORDER — LANOLIN HYDROUS EX OINT: 1.0000 "application " | TOPICAL_OINTMENT | CUTANEOUS | Status: DC | PRN

## 2012-05-20 MED ORDER — MEPERIDINE HCL 25 MG/ML IJ SOLN: 6.2500 mg | INTRAMUSCULAR | Status: DC | PRN

## 2012-05-20 MED ORDER — ONDANSETRON HCL 4 MG/2ML IJ SOLN: 4.0000 mg | Freq: Three times a day (TID) | INTRAMUSCULAR | Status: DC | PRN

## 2012-05-20 MED ORDER — METOCLOPRAMIDE HCL 5 MG/ML IJ SOLN: 10.0000 mg | Freq: Three times a day (TID) | INTRAMUSCULAR | Status: DC | PRN

## 2012-05-20 MED ORDER — DIPHENHYDRAMINE HCL 25 MG PO CAPS: 25.0000 mg | ORAL_CAPSULE | Freq: Four times a day (QID) | ORAL | Status: DC | PRN

## 2012-05-20 MED ORDER — SODIUM CHLORIDE 0.9 % IJ SOLN: 3.0000 mL | INTRAMUSCULAR | Status: DC | PRN

## 2012-05-20 MED ORDER — KETOROLAC TROMETHAMINE 30 MG/ML IJ SOLN: 30.0000 mg | Freq: Four times a day (QID) | INTRAMUSCULAR | Status: AC | PRN

## 2012-05-20 MED ORDER — SIMETHICONE 80 MG PO CHEW
80.0000 mg | CHEWABLE_TABLET | Freq: Three times a day (TID) | ORAL | Status: DC
  Administered 2012-05-20 – 2012-05-23 (×9): 80 mg via ORAL

## 2012-05-20 MED ORDER — OXYTOCIN 10 UNIT/ML IJ SOLN
40.0000 [IU] | INTRAVENOUS | Status: DC | PRN
  Administered 2012-05-20: 40 [IU] via INTRAVENOUS

## 2012-05-20 MED ORDER — NALBUPHINE SYRINGE 5 MG/0.5 ML
5.0000 mg | INJECTION | INTRAMUSCULAR | Status: DC | PRN
  Filled 2012-05-20: qty 1

## 2012-05-20 MED ORDER — SENNOSIDES-DOCUSATE SODIUM 8.6-50 MG PO TABS
2.0000 | ORAL_TABLET | Freq: Every day | ORAL | Status: DC
  Administered 2012-05-20 – 2012-05-22 (×3): 2 via ORAL

## 2012-05-20 MED ORDER — NALOXONE HCL 1 MG/ML IJ SOLN
1.0000 ug/kg/h | INTRAVENOUS | Status: DC | PRN
  Filled 2012-05-20: qty 2

## 2012-05-20 MED ORDER — DIPHENHYDRAMINE HCL 50 MG/ML IJ SOLN: 25.0000 mg | INTRAMUSCULAR | Status: DC | PRN

## 2012-05-20 MED ORDER — LACTATED RINGERS IV SOLN
INTRAVENOUS | Status: DC
  Administered 2012-05-20: 18:00:00 via INTRAVENOUS

## 2012-05-20 MED ORDER — LACTATED RINGERS IV SOLN
INTRAVENOUS | Status: DC | PRN
  Administered 2012-05-20: 10:00:00 via INTRAVENOUS

## 2012-05-20 MED ORDER — ONDANSETRON HCL 4 MG/2ML IJ SOLN
INTRAMUSCULAR | Status: AC
  Filled 2012-05-20: qty 2

## 2012-05-20 MED ORDER — SCOPOLAMINE 1 MG/3DAYS TD PT72: 1.0000 | MEDICATED_PATCH | Freq: Once | TRANSDERMAL | Status: DC

## 2012-05-20 MED ORDER — ONDANSETRON HCL 4 MG PO TABS: 4.0000 mg | ORAL_TABLET | ORAL | Status: DC | PRN

## 2012-05-20 MED ORDER — MORPHINE SULFATE (PF) 0.5 MG/ML IJ SOLN
INTRAMUSCULAR | Status: DC | PRN
  Administered 2012-05-20: .2 mg via INTRATHECAL

## 2012-05-20 MED ORDER — CEFAZOLIN SODIUM-DEXTROSE 2-3 GM-% IV SOLR
INTRAVENOUS | Status: AC
  Filled 2012-05-20: qty 50

## 2012-05-20 MED ORDER — SIMETHICONE 80 MG PO CHEW: 80.0000 mg | CHEWABLE_TABLET | ORAL | Status: DC | PRN

## 2012-05-20 MED ORDER — DIPHENHYDRAMINE HCL 25 MG PO CAPS: 25.0000 mg | ORAL_CAPSULE | ORAL | Status: DC | PRN

## 2012-05-20 MED ORDER — HYDROMORPHONE HCL PF 1 MG/ML IJ SOLN
INTRAMUSCULAR | Status: AC
  Administered 2012-05-20: 0.5 mg via INTRAVENOUS
  Filled 2012-05-20: qty 1

## 2012-05-20 MED ORDER — OXYTOCIN 10 UNIT/ML IJ SOLN
INTRAMUSCULAR | Status: AC
  Filled 2012-05-20: qty 4

## 2012-05-20 MED ORDER — KETOROLAC TROMETHAMINE 60 MG/2ML IM SOLN: 60.0000 mg | Freq: Once | INTRAMUSCULAR | Status: AC | PRN

## 2012-05-20 MED ORDER — TETANUS-DIPHTH-ACELL PERTUSSIS 5-2.5-18.5 LF-MCG/0.5 IM SUSP: 0.5000 mL | Freq: Once | INTRAMUSCULAR | Status: DC

## 2012-05-20 MED ORDER — ZOLPIDEM TARTRATE 5 MG PO TABS: 5.0000 mg | ORAL_TABLET | Freq: Every evening | ORAL | Status: DC | PRN

## 2012-05-20 MED ORDER — NALOXONE HCL 0.4 MG/ML IJ SOLN: 0.4000 mg | INTRAMUSCULAR | Status: DC | PRN

## 2012-05-20 MED ORDER — LACTATED RINGERS IV SOLN
INTRAVENOUS | Status: DC | PRN
  Administered 2012-05-20 (×3): via INTRAVENOUS

## 2012-05-20 MED ORDER — MENTHOL 3 MG MT LOZG: 1.0000 | LOZENGE | OROMUCOSAL | Status: DC | PRN

## 2012-05-20 MED ORDER — FERROUS SULFATE 325 (65 FE) MG PO TABS: 325.0000 mg | ORAL_TABLET | Freq: Two times a day (BID) | ORAL | Status: DC

## 2012-05-20 MED ORDER — KETOROLAC TROMETHAMINE 60 MG/2ML IM SOLN
INTRAMUSCULAR | Status: AC
  Administered 2012-05-20: 60 mg via INTRAMUSCULAR
  Filled 2012-05-20: qty 2

## 2012-05-20 MED ORDER — LACTATED RINGERS IV SOLN
Freq: Once | INTRAVENOUS | Status: AC
  Administered 2012-05-20: 09:00:00 via INTRAVENOUS

## 2012-05-20 MED ORDER — CEFAZOLIN SODIUM-DEXTROSE 2-3 GM-% IV SOLR
2.0000 g | INTRAVENOUS | Status: AC
  Administered 2012-05-20: 2 g via INTRAVENOUS

## 2012-05-20 MED ORDER — SCOPOLAMINE 1 MG/3DAYS TD PT72
MEDICATED_PATCH | TRANSDERMAL | Status: AC
  Administered 2012-05-20: 1.5 mg via TRANSDERMAL
  Filled 2012-05-20: qty 1

## 2012-05-20 MED ORDER — 0.9 % SODIUM CHLORIDE (POUR BTL) OPTIME
TOPICAL | Status: DC | PRN
  Administered 2012-05-20: 1000 mL

## 2012-05-20 MED ORDER — FENTANYL CITRATE 0.05 MG/ML IJ SOLN
INTRAMUSCULAR | Status: AC
  Filled 2012-05-20: qty 2

## 2012-05-20 MED ORDER — PRENATAL MULTIVITAMIN CH
1.0000 | ORAL_TABLET | Freq: Every day | ORAL | Status: DC
  Administered 2012-05-21 – 2012-05-22 (×2): 1 via ORAL
  Filled 2012-05-20 (×2): qty 1

## 2012-05-20 MED ORDER — BUPIVACAINE IN DEXTROSE 0.75-8.25 % IT SOLN
INTRATHECAL | Status: DC | PRN
  Administered 2012-05-20: 10.5 mg via INTRATHECAL

## 2012-05-20 MED ORDER — PHENYLEPHRINE HCL 10 MG/ML IJ SOLN
INTRAMUSCULAR | Status: DC | PRN
  Administered 2012-05-20 (×2): 80 ug via INTRAVENOUS

## 2012-05-20 MED ORDER — HYDROMORPHONE HCL PF 1 MG/ML IJ SOLN
0.2500 mg | INTRAMUSCULAR | Status: DC | PRN
  Administered 2012-05-20: 0.5 mg via INTRAVENOUS

## 2012-05-20 MED ORDER — KETOROLAC TROMETHAMINE 30 MG/ML IJ SOLN
30.0000 mg | Freq: Four times a day (QID) | INTRAMUSCULAR | Status: AC | PRN
  Administered 2012-05-20: 30 mg via INTRAVENOUS
  Filled 2012-05-20: qty 1

## 2012-05-20 MED ORDER — KETOROLAC TROMETHAMINE 30 MG/ML IJ SOLN: 15.0000 mg | Freq: Once | INTRAMUSCULAR | Status: DC | PRN

## 2012-05-20 MED ORDER — OXYTOCIN 40 UNITS IN LACTATED RINGERS INFUSION - SIMPLE MED: 62.5000 mL/h | INTRAVENOUS | Status: AC

## 2012-05-20 MED ORDER — FLEET ENEMA 7-19 GM/118ML RE ENEM: 1.0000 | ENEMA | Freq: Every day | RECTAL | Status: DC | PRN

## 2012-05-20 MED ORDER — WITCH HAZEL-GLYCERIN EX PADS: 1.0000 "application " | MEDICATED_PAD | CUTANEOUS | Status: DC | PRN

## 2012-05-20 MED ORDER — BISACODYL 10 MG RE SUPP: 10.0000 mg | Freq: Every day | RECTAL | Status: DC | PRN

## 2012-05-20 MED ORDER — MORPHINE SULFATE 0.5 MG/ML IJ SOLN
INTRAMUSCULAR | Status: AC
  Filled 2012-05-20: qty 10

## 2012-05-20 MED ORDER — PROMETHAZINE HCL 25 MG/ML IJ SOLN: 6.2500 mg | INTRAMUSCULAR | Status: DC | PRN

## 2012-05-20 MED ORDER — DIBUCAINE 1 % RE OINT: 1.0000 "application " | TOPICAL_OINTMENT | RECTAL | Status: DC | PRN

## 2012-05-20 MED ORDER — NALBUPHINE SYRINGE 5 MG/0.5 ML
INJECTION | INTRAMUSCULAR | Status: AC
  Administered 2012-05-20: 10 mg via SUBCUTANEOUS
  Filled 2012-05-20: qty 1

## 2012-05-20 SURGICAL SUPPLY — 38 items
BARRIER ADHS 3X4 INTERCEED (GAUZE/BANDAGES/DRESSINGS) ×2 IMPLANT
BENZOIN TINCTURE PRP APPL 2/3 (GAUZE/BANDAGES/DRESSINGS) ×4 IMPLANT
CLOTH BEACON ORANGE TIMEOUT ST (SAFETY) ×2 IMPLANT
CONTAINER PREFILL 10% NBF 15ML (MISCELLANEOUS) IMPLANT
DRAIN JACKSON PRT FLT 10 (DRAIN) IMPLANT
DRAPE LG THREE QUARTER DISP (DRAPES) ×2 IMPLANT
DRSG OPSITE POSTOP 4X10 (GAUZE/BANDAGES/DRESSINGS) ×4 IMPLANT
DURAPREP 26ML APPLICATOR (WOUND CARE) ×2 IMPLANT
ELECT REM PT RETURN 9FT ADLT (ELECTROSURGICAL) ×2
ELECTRODE REM PT RTRN 9FT ADLT (ELECTROSURGICAL) ×1 IMPLANT
EVACUATOR SILICONE 100CC (DRAIN) IMPLANT
EXTRACTOR VACUUM M CUP 4 TUBE (SUCTIONS) ×2 IMPLANT
GLOVE BIO SURGEON STRL SZ 6.5 (GLOVE) ×2 IMPLANT
GLOVE BIOGEL PI IND STRL 7.0 (GLOVE) ×1 IMPLANT
GLOVE BIOGEL PI INDICATOR 7.0 (GLOVE) ×1
GLOVE SURG SS PI 7.0 STRL IVOR (GLOVE) ×2 IMPLANT
GOWN STRL REIN XL XLG (GOWN DISPOSABLE) ×4 IMPLANT
KIT ABG SYR 3ML LUER SLIP (SYRINGE) IMPLANT
NEEDLE HYPO 25X5/8 SAFETYGLIDE (NEEDLE) IMPLANT
NS IRRIG 1000ML POUR BTL (IV SOLUTION) ×2 IMPLANT
PACK C SECTION WH (CUSTOM PROCEDURE TRAY) ×2 IMPLANT
PAD OB MATERNITY 4.3X12.25 (PERSONAL CARE ITEMS) ×2 IMPLANT
RTRCTR C-SECT PINK 25CM LRG (MISCELLANEOUS) IMPLANT
STAPLER VISISTAT 35W (STAPLE) IMPLANT
STRIP CLOSURE SKIN 1/2X4 (GAUZE/BANDAGES/DRESSINGS) ×4 IMPLANT
SUT CHROMIC 0 CT 1 (SUTURE) ×2 IMPLANT
SUT MNCRL AB 3-0 PS2 27 (SUTURE) ×2 IMPLANT
SUT PLAIN 2 0 (SUTURE) ×2
SUT PLAIN 2 0 XLH (SUTURE) ×2 IMPLANT
SUT PLAIN ABS 2-0 CT1 27XMFL (SUTURE) ×2 IMPLANT
SUT SILK 2 0 SH (SUTURE) IMPLANT
SUT VIC AB 0 CTX 36 (SUTURE) ×4
SUT VIC AB 0 CTX36XBRD ANBCTRL (SUTURE) ×4 IMPLANT
SUT VIC AB 2-0 SH 27 (SUTURE) ×1
SUT VIC AB 2-0 SH 27XBRD (SUTURE) ×1 IMPLANT
TOWEL OR 17X24 6PK STRL BLUE (TOWEL DISPOSABLE) ×6 IMPLANT
TRAY FOLEY CATH 14FR (SET/KITS/TRAYS/PACK) ×2 IMPLANT
WATER STERILE IRR 1000ML POUR (IV SOLUTION) ×2 IMPLANT

## 2012-05-20 NOTE — Transfer of Care (Signed)
Immediate Anesthesia Transfer of Care Note  Patient: Katherine Riggs  Procedure(s) Performed: Procedure(s) with comments: CESAREAN SECTION REPEAT  (N/A) - Repeat  Patient Location: PACU  Anesthesia Type:Spinal  Level of Consciousness: awake, alert  and oriented  Airway & Oxygen Therapy: Patient Spontanous Breathing  Post-op Assessment: Report given to PACU RN and Post -op Vital signs reviewed and stable  Post vital signs: stable  Complications: No apparent anesthesia complications

## 2012-05-20 NOTE — Anesthesia Postprocedure Evaluation (Signed)
  Anesthesia Post Note  Patient: Katherine Riggs  Procedure(s) Performed: Procedure(s) (LRB): CESAREAN SECTION REPEAT  (N/A)  Anesthesia type: Spinal  Patient location: PACU  Post pain: Pain level controlled  Post assessment: Post-op Vital signs reviewed  Last Vitals:  Filed Vitals:   05/20/12 1230  BP:   Pulse: 55  Temp:   Resp: 18    Post vital signs: Reviewed  Level of consciousness: awake  Complications: No apparent anesthesia complications

## 2012-05-20 NOTE — Anesthesia Procedure Notes (Signed)
Spinal  Patient location during procedure: OR Start time: 05/20/2012 9:29 AM End time: 05/20/2012 9:32 AM Staffing Anesthesiologist: Sandrea Hughs Performed by: anesthesiologist  Preanesthetic Checklist Completed: patient identified, site marked, surgical consent, pre-op evaluation, timeout performed, IV checked, risks and benefits discussed and monitors and equipment checked Spinal Block Patient position: sitting Prep: DuraPrep Patient monitoring: heart rate, cardiac monitor, continuous pulse ox and blood pressure Approach: midline Location: L3-4 Injection technique: single-shot Needle Needle type: Sprotte  Needle gauge: 24 G Needle length: 9 cm Needle insertion depth: 5 cm Assessment Sensory level: T4

## 2012-05-20 NOTE — Anesthesia Preprocedure Evaluation (Signed)
Anesthesia Evaluation  Patient identified by MRN, date of birth, ID band Patient awake    Reviewed: Allergy & Precautions, H&P , NPO status , Patient's Chart, lab work & pertinent test results  Airway Mallampati: I TM Distance: >3 FB Neck ROM: full    Dental no notable dental hx.    Pulmonary neg pulmonary ROS,  breath sounds clear to auscultation  Pulmonary exam normal       Cardiovascular negative cardio ROS      Neuro/Psych negative neurological ROS  negative psych ROS   GI/Hepatic negative GI ROS, Neg liver ROS,   Endo/Other  negative endocrine ROS  Renal/GU   negative genitourinary   Musculoskeletal negative musculoskeletal ROS (+)   Abdominal Normal abdominal exam  (+)   Peds negative pediatric ROS (+)  Hematology negative hematology ROS (+)   Anesthesia Other Findings   Reproductive/Obstetrics (+) Pregnancy                           Anesthesia Physical Anesthesia Plan  ASA: II  Anesthesia Plan: Spinal   Post-op Pain Management:    Induction:   Airway Management Planned:   Additional Equipment:   Intra-op Plan:   Post-operative Plan:   Informed Consent: I have reviewed the patients History and Physical, chart, labs and discussed the procedure including the risks, benefits and alternatives for the proposed anesthesia with the patient or authorized representative who has indicated his/her understanding and acceptance.     Plan Discussed with: CRNA and Surgeon  Anesthesia Plan Comments:         Anesthesia Quick Evaluation

## 2012-05-20 NOTE — Anesthesia Postprocedure Evaluation (Addendum)
Anesthesia Post Note  Patient: Katherine Riggs  Procedure(s) Performed: Procedure(s) (LRB): CESAREAN SECTION REPEAT  (N/A)  Anesthesia type: Spinal  Patient location: Mother/Baby  Post pain: Pain level controlled  Post assessment: Post-op Vital signs reviewed  Last Vitals:  Filed Vitals:   05/20/12 1500  BP: 121/82  Pulse: 63  Temp: 36.2 C  Resp: 20       Anesthesia Post Note

## 2012-05-20 NOTE — OR Nursing (Signed)
05/20/2012 Interceed 3" X 4" used intraoperatively by Dr Normand Sloop.

## 2012-05-20 NOTE — Op Note (Signed)
Cesarean Section Procedure Note   DYNASTIE KNOOP  05/20/2012  Indications: Scheduled Proceedure/Maternal Request   Pre-operative Diagnosis: Prior Cesarean Sections.   Post-operative Diagnosis: Same   Surgeon: Surgeon(s) and Role:    * Michael Litter, MD - Primary   Assistants: Haroldine Laws CNM  Anesthesia: spinal   Procedure Details:  The patient was seen in the Holding Room. The risks, benefits, complications, treatment options, and expected outcomes were discussed with the patient. The patient concurred with the proposed plan, giving informed consent. identified as Bonnetta Barry and the procedure verified as C-Section Delivery. A Time Out was held and the above information confirmed.  After induction of anesthesia, the patient was draped and prepped in the usual sterile manner. A transverse incision was made and carried down through the subcutaneous tissue to the fascia. Fascial incision was made in the midline and extended transversely. The fascia was separated from the underlying rectus muscle superiorly and inferiorly. The peritoneum was identified and entered. Peritoneal incision was extended longitudinally with good visualization of bowel and bladder. The utero-vesical peritoneal reflection was incised transversely and the bladder flap was bluntly freed from the lower uterine segment.  I could not place an alexsis retractor was placed in the abdomen because of adhesions.   A low transverse uterine incision was made. Delivered from cephalic presentation was a  infant, with Apgar scores of 7 at one minute and 9 at five minutes. Cord ph was not sent the umbilical cord was clamped and cut cord blood was obtained for evaluation. The placenta was removed Intact and appeared normal. The uterine outline, tubes and ovaries appeared could not be seen because of adhesions}. The uterine incision was closed with running locked sutures of 0Vicryl. A second layer 0 vicrlyl was used to imbricate the  uterine incision    Hemostasis was observed. Lavage was carried out until clear. Intercede was placed on the incision line.  The peritoneum was closed with 0 chromic.  The muscles were examined and any bleeders were made hemostatic using bovie cautery device.   The fascia was then reapproximated with running sutures of 0 vicryl.  The subcutaneous tissue was reapproximated  With interrupted stitches using 2-0 plain gut. The subcuticular closure was performed using 3-36monocryl     Instrument, sponge, and needle counts were correct prior the abdominal closure and were correct at the conclusion of the case.    Findings: infant was delivered from vtx presentation. The fluid was clear.     Estimated Blood Loss: Total IV Fluids:   Urine Output: 200CC OF clear urine  Specimens none  Complications: no complications  Disposition: PACU - hemodynamically stable.   Maternal Condition: stable   Baby condition / location:  nursery-stable  Attending Attestation: I was present and scrubbed for the entire procedure.   Signed: Surgeon(s): Michael Litter, MD

## 2012-05-21 ENCOUNTER — Encounter (HOSPITAL_COMMUNITY): Payer: Self-pay | Admitting: Obstetrics and Gynecology

## 2012-05-21 LAB — CBC
HCT: 27.2 % — ABNORMAL LOW (ref 36.0–46.0)
Hemoglobin: 9.1 g/dL — ABNORMAL LOW (ref 12.0–15.0)
MCH: 29.9 pg (ref 26.0–34.0)
MCHC: 33.5 g/dL (ref 30.0–36.0)

## 2012-05-21 MED ORDER — POLYSACCHARIDE IRON COMPLEX 150 MG PO CAPS
150.0000 mg | ORAL_CAPSULE | Freq: Every day | ORAL | Status: DC
  Administered 2012-05-21 – 2012-05-23 (×3): 150 mg via ORAL
  Filled 2012-05-21 (×3): qty 1

## 2012-05-21 MED ORDER — RHO D IMMUNE GLOBULIN 1500 UNIT/2ML IJ SOLN
300.0000 ug | Freq: Once | INTRAMUSCULAR | Status: AC
  Administered 2012-05-21: 300 ug via INTRAMUSCULAR
  Filled 2012-05-21: qty 2

## 2012-05-21 NOTE — Progress Notes (Signed)
UR chart review completed.  

## 2012-05-21 NOTE — Progress Notes (Signed)
Patient ID: Katherine Riggs, female   DOB: [birth year], 27 y.o.   MRN: [id number] Subjective: Postpartum Day 1: Cesarean Delivery secondary to: repeat  Patient reports incisional pain. Has not voided yet, since foley out, slept overnight  no complaints, up ad lib without syncope Pain well controlled with po meds BF started  Mood stable, bonding well   Objective: Vital signs in last 24 hours: Temp:  [97.2 F (36.2 C)-98.7 F (37.1 C)] 98.7 F (37.1 C) (05/08 0605) Pulse Rate:  [54-80] 72 (05/08 0605) Resp:  [11-20] 18 (05/08 0605) BP: (91-137)/(48-89) 99/65 mmHg (05/08 0605) SpO2:  [97 %-100 %] 98 % (05/08 0653) Weight:  [201 lb (91.173 kg)] 201 lb (91.173 kg) (05/08 0001)  Physical Exam:  General: alert and no distress Heart: RRR Lungs: CTAB Abdomen: BS x4 hypoactive  Uterine Fundus: firm Incision: honeycomb dressing w some drainage visible    Lochia: appropriate DVT Evaluation: No evidence of DVT seen on physical exam. Negative Homan's sign. No significant calf/ankle edema.   Recent Labs  05/18/12 1130 05/21/12 0610  HGB 11.1* 9.1*  HCT 33.2* 27.2*    Assessment/Plan: Status post Cesarean section. Doing well postoperatively.  Continue current care. Mild anemia will add PO FE supp    Doye Montilla M 05/21/2012, 10:19 AM

## 2012-05-22 LAB — RH IG WORKUP (INCLUDES ABO/RH)
Fetal Screen: NEGATIVE
Gestational Age(Wks): 39

## 2012-05-22 LAB — TYPE AND SCREEN: Antibody Screen: POSITIVE

## 2012-05-22 NOTE — Progress Notes (Addendum)
Post Partum Day 2: S/P Repeat c/s  Subjective: Patient up ad lib, denies syncope or dizziness. Pain well controlled.  Feeding:  Breast/Bottle Contraceptive plan:   Nexplanon  Objective: Blood pressure 103/58, pulse 65, temperature 98 F (36.7 C), temperature source Oral, resp. rate 16, height 5\' 1"  (1.549 m), weight 201 lb (91.173 kg), SpO2 99.00%, unknown if currently breastfeeding.  Physical Exam:  General: alert and no distress Lochia: appropriate Uterine Fundus: firm Incision: healing well DVT Evaluation: No evidence of DVT seen on physical exam. Negative Homan's sign.   Recent Labs  05/21/12 0610  HGB 9.1*  HCT 27.2*    Assessment/Plan: POD #2 from cesarean PO Iron rx'd yesterday A neg  Continue current care Rhogam to be given Plan for discharge tomorrow   LOS: 2 days   OXLEY, JENNIFER 05/22/2012, 8:23 AM

## 2012-05-23 MED ORDER — POLYSACCHARIDE IRON COMPLEX 150 MG PO CAPS: 150.0000 mg | ORAL_CAPSULE | Freq: Every day | ORAL | Status: DC

## 2012-05-23 MED ORDER — HYDROCODONE-ACETAMINOPHEN 5-325 MG PO TABS: 1.0000 | ORAL_TABLET | Freq: Four times a day (QID) | ORAL | Status: DC | PRN

## 2012-05-23 MED ORDER — IBUPROFEN 600 MG PO TABS: 600.0000 mg | ORAL_TABLET | Freq: Four times a day (QID) | ORAL | Status: DC

## 2012-05-23 NOTE — Lactation Note (Signed)
This note was copied from the chart of Katherine Riggs. Lactation Consultation Note  Patient Name: Katherine Riggs [id number] Date: 05/23/2012 Reason for consult: Follow-up assessment   Maternal Data Formula Feeding for Exclusion: Yes Reason for exclusion: Mother's choice to formula and breast feed on admission  Feeding   LATCH Score/Interventions       Type of Nipple: Everted at rest and after stimulation  Comfort (Breast/Nipple): Filling, red/small blisters or bruises, mild/mod discomfort  Problem noted: Mild/Moderate discomfort (positional stripes noted on both nipples) Interventions (Mild/moderate discomfort): Comfort gels        Lactation Tools Discussed/Used     Consult Status Consult Status: Complete  Mom reports that nipples are sore and she has been giving bottles to let her nipples get a break. Positional striped noted n both breasts. Encouraged to change position of baby at the breast. Reports that breasts are feeling fuller this morning. Reviewed engorgement prevention and treatment. Comfort gels given with instructions. No questions at present. To call prn  Pamelia Hoit 05/23/2012, 7:53 AM

## 2012-05-23 NOTE — Discharge Summary (Signed)
  Cesarean Section Delivery Discharge Summary  Katherine Riggs  DOB:    [birth year] MRN:    [id number] CSN:    [id number]  Date of admission:                  05/20/12  Date of discharge:                   05/23/12  Procedures this admission: Repeat c/s by Dr. Normand Sloop  Date of Delivery: 05/20/12  Newborn Data:  Live born female  Birth Weight: 7 lb 13.6 oz (3560 g) APGAR: 7, 9  Home with mother. Circumcision Plan: n/a  History of Present Illness:  Katherine Riggs is a 27 y.o. female, [phone], who presents at [redacted]w[redacted]d weeks gestation. The patient has been followed at the [facility] and Gynecology division of [facility].    Her pregnancy has been complicated by:  Patient Active Problem List   Diagnosis Date Noted  . Status post repeat low transverse cesarean section 05/20/2012  . TAB X 2 11/21/2011  . Hx pyelonephritis 11/21/2011  . Rh negative, maternal 01/15/2011  . History of cesarean delivery x 2 01/15/2011   Hospital course:  The patient was admitted for repeat c/s.   Her postpartum course was not complicated.  She was discharged to home on postpartum day 3 doing well.  Feeding:  breast and bottle  Contraception:  nexplanon  Discharge hemoglobin:  Hemoglobin  Date Value Range Status  05/21/2012 9.1* 12.0 - 15.0 g/dL Final     HCT  Date Value Range Status  05/21/2012 27.2* 36.0 - 46.0 % Final    Discharge Physical Exam:   General: alert, cooperative and no distress Lochia: appropriate Uterine Fundus: firm Incision: healing well DVT Evaluation: No evidence of DVT seen on physical exam. Negative Homan's sign.  Intrapartum Procedures: cesarean: low cervical, transverse Postpartum Procedures: Rho(D) Ig Complications-Operative and Postpartum: none  Discharge Diagnoses: Term Pregnancy-delivered and mild anemia  Discharge Information:  Activity:           per [facility] handbook Diet:                routine Medications: PNV,  Ibuprofen, Iron and Vicodin Condition:      stable Instructions:  refer to practice specific booklet Discharge to: home     Haroldine Laws 05/23/2012

## 2013-11-15 ENCOUNTER — Encounter (HOSPITAL_COMMUNITY): Payer: Self-pay | Admitting: Obstetrics and Gynecology

## 2016-05-17 ENCOUNTER — Encounter (HOSPITAL_COMMUNITY): Payer: Self-pay | Admitting: Emergency Medicine

## 2016-05-17 ENCOUNTER — Emergency Department (HOSPITAL_COMMUNITY): Payer: Medicaid Other

## 2016-05-17 DIAGNOSIS — S8012XA Contusion of left lower leg, initial encounter: Secondary | ICD-10-CM | POA: Insufficient documentation

## 2016-05-17 DIAGNOSIS — Y999 Unspecified external cause status: Secondary | ICD-10-CM | POA: Insufficient documentation

## 2016-05-17 DIAGNOSIS — Y9364 Activity, baseball: Secondary | ICD-10-CM | POA: Insufficient documentation

## 2016-05-17 DIAGNOSIS — I10 Essential (primary) hypertension: Secondary | ICD-10-CM | POA: Insufficient documentation

## 2016-05-17 DIAGNOSIS — Y929 Unspecified place or not applicable: Secondary | ICD-10-CM | POA: Insufficient documentation

## 2016-05-17 DIAGNOSIS — X58XXXA Exposure to other specified factors, initial encounter: Secondary | ICD-10-CM | POA: Insufficient documentation

## 2016-05-17 DIAGNOSIS — Z79899 Other long term (current) drug therapy: Secondary | ICD-10-CM | POA: Insufficient documentation

## 2016-05-17 NOTE — ED Triage Notes (Signed)
Patient reports landing on left knee about 2 weeks ago playing softball. Pt has been walking on leg but has repeatedly hit shin. Pt has lump below left knee and bruising. Pt states she is now having numbness and ankle swelling.

## 2016-05-18 ENCOUNTER — Emergency Department (HOSPITAL_COMMUNITY)
Admission: EM | Admit: 2016-05-18 | Discharge: 2016-05-18 | Disposition: A | Discharge status: Home / Self Care | Payer: Medicaid Other | Attending: Emergency Medicine | Admitting: Emergency Medicine

## 2016-05-18 DIAGNOSIS — T1490XA Injury, unspecified, initial encounter: Secondary | ICD-10-CM

## 2016-05-18 DIAGNOSIS — S8012XA Contusion of left lower leg, initial encounter: Secondary | ICD-10-CM

## 2016-05-18 NOTE — ED Provider Notes (Signed)
[facility]-EMERGENCY DEPT Provider Note   CSN: [id number] Arrival date & time: 05/17/16  2038  By signing my name below, I, Katherine Riggs, attest that this documentation has been prepared under the direction and in the presence of [name], PA-C.  Electronically Signed: Karren Cobble, ED Scribe. 05/18/16. 1:54 AM.  History   Chief Complaint Chief Complaint  Patient presents with  . Leg Injury   The history is provided by the patient. No language interpreter was used.    HPI Comments: Katherine Riggs is a 31 y.o. female with a PMHx of HTN, who presents to the Emergency Department complaining of persistent, gradually worsening left shin pain that started two weeks ago s/p landing on her shin while playing softball. Her associated symptoms include numbness, swelling, and bruising to her left lower leg. Tonight PTA she noticed a new onset of swelling to her left lower leg and left foot. Pt notes when the incident happened she instantly felt pain and heard "a pop". She notes that she continues to play softball and these traumatic incidents continue to occur in the same location. She is not currently on any anti-coagulates. Denies any other associated symptoms.   Past Medical History:  Diagnosis Date  . Abortion, elective or therapeutic 2011 and 2012  . Complication of anesthesia    high BP in PACU- resolved in PACU  . Cystitis 2012  . Hypertension, postpartum condition or complication 2008  . No pertinent past medical history   . Pyelonephritis 2012  . Status post repeat low transverse cesarean section 05/20/2012    Patient Active Problem List   Diagnosis Date Noted  . Status post repeat low transverse cesarean section 05/20/2012  . TAB X 2 11/21/2011  . Hx pyelonephritis 11/21/2011  . Rh negative, maternal 01/15/2011  . History of cesarean delivery x 2 01/15/2011    Past Surgical History:  Procedure Laterality Date  . CESAREAN SECTION  12/2006   baby's head position  . CESAREAN  SECTION  01/15/2011   Procedure: CESAREAN SECTION;  Surgeon: Michael Litter, MD;  Location: [facility];  Service: Gynecology;  Laterality: N/A;  . CESAREAN SECTION N/A 05/20/2012   Procedure: CESAREAN SECTION REPEAT ;  Surgeon: Michael Litter, MD;  Location: [facility];  Service: Obstetrics;  Laterality: N/A;  Repeat    OB History    Gravida Para Term Preterm AB Living   5 3 3   2 3    SAB TAB Ectopic Multiple Live Births     2     3       Home Medications    Prior to Admission medications   Medication Sig Start Date End Date Taking? Authorizing Provider  HYDROcodone-acetaminophen (NORCO) 5-325 MG per tablet Take 1 tablet by mouth every 6 (six) hours as needed for pain. 05/23/12   Haroldine Laws, CNM  ibuprofen (ADVIL,MOTRIN) 600 MG tablet Take 1 tablet (600 mg total) by mouth every 6 (six) hours. 05/23/12   Haroldine Laws, CNM  iron polysaccharides (NIFEREX) 150 MG capsule Take 1 capsule (150 mg total) by mouth daily. 05/23/12   Haroldine Laws, CNM  prenatal vitamin w/FE, FA (PRENATAL 1 + 1) 27-1 MG TABS Take 1 tablet by mouth daily.      [provider]    Family History Family History  Problem Relation Age of Onset  . Migraines Mother   . Drug abuse Mother   . Asthma Brother   . Diabetes Paternal Aunt   . Drug abuse  Paternal Aunt   . Cancer Maternal Grandmother     breast and esophageal  . Heart attack Paternal Grandmother   . Hypertension Paternal Grandmother   . Diabetes Paternal Grandmother   . Hypertension Paternal Grandfather    Social History Social History  Substance Use Topics  . Smoking status: Never Smoker  . Smokeless tobacco: Never Used  . Alcohol use No    Allergies   Patient has no known allergies.  Review of Systems Review of Systems  All other systems reviewed and are negative.    Physical Exam Updated Vital Signs BP 127/60 (BP Location: Left Arm)   Pulse 82   Temp 98.8 F (37.1 C) (Oral)   Resp 20   LMP 05/04/2016   SpO2 100%    Physical Exam  Constitutional: She is oriented to person, place, and time. She appears well-developed and well-nourished.  HENT:  Head: Normocephalic and atraumatic.  Cardiovascular: Normal rate, regular rhythm and intact distal pulses.   No murmur heard. Pulmonary/Chest: Effort normal and breath sounds normal. No respiratory distress.  Abdominal: Soft. There is no tenderness. There is no rebound and no guarding.  Musculoskeletal: She exhibits no edema or tenderness.  Range of motion strength of left lower extremities 5/5  Neurological: She is alert and oriented to person, place, and time.  Sensation intact  Skin: Skin is warm and dry. Capillary refill takes less than 2 seconds.  Contusion to left anterior shin with some associated ecchymosis in the left lateral foot  Psychiatric: She has a normal mood and affect. Her behavior is normal.  Nursing note and vitals reviewed.  ED Treatments / Results   DIAGNOSTIC STUDIES: Oxygen Saturation is 100% on RA, normal by my interpretation.   COORDINATION OF CARE: 1:36 AM-Discussed next steps with pt. Pt verbalized understanding and is agreeable with the plan.   Labs (all labs ordered are listed, but only abnormal results are displayed) Labs Reviewed - No data to display  EKG  EKG Interpretation None       Radiology Dg Tibia/fibula Left  Result Date: 05/17/2016 CLINICAL DATA:  Pain after trauma 2 weeks ago. EXAM: LEFT TIBIA AND FIBULA - 2 VIEW COMPARISON:  None. FINDINGS: There is no evidence of fracture or other focal bone lesions. Soft tissues are unremarkable. IMPRESSION: Negative. Electronically Signed   By: Gerome Sam III M.D   On: 05/17/2016 22:22    Procedures Procedures (including critical care time)  Medications Ordered in ED Medications - No data to display   Initial Impression / Assessment and Plan / ED Course  I have reviewed the triage vital signs and the nursing notes.  Pertinent labs & imaging results  that were available during my care of the patient were reviewed by me and considered in my medical decision making (see chart for details).    Patient X-Ray negative for obvious fracture or dislocation. Leg is bruised, but no evidence of fracture or large hematoma. Pt advised to follow up with orthopedics if symptoms do not improve in 1 week. Conservative therapy recommended and discussed. Patient will be discharged home & is agreeable with above plan. Returns precautions discussed. Pt appears safe for discharge.   Final Clinical Impressions(s) / ED Diagnoses   Final diagnoses:  Contusion of left lower extremity, initial encounter    New Prescriptions New Prescriptions   No medications on file   I personally performed the services described in this documentation, which was scribed in my presence. The recorded information has  been reviewed and is accurate.       Roxy HorsemanBrowning, Raydin Bielinski, PA-C 05/18/16 0205    Zadie Rhineonald Wickline, MD 05/18/16 [phone]

## 2017-03-04 LAB — OB RESULTS CONSOLE ABO/RH: RH TYPE: NEGATIVE

## 2017-03-04 LAB — OB RESULTS CONSOLE GC/CHLAMYDIA
Chlamydia: NEGATIVE
Gonorrhea: NEGATIVE

## 2017-03-04 LAB — OB RESULTS CONSOLE RUBELLA ANTIBODY, IGM: RUBELLA: IMMUNE

## 2017-03-04 LAB — OB RESULTS CONSOLE HEPATITIS B SURFACE ANTIGEN: Hepatitis B Surface Ag: NEGATIVE

## 2017-03-04 LAB — OB RESULTS CONSOLE RPR: RPR: NONREACTIVE

## 2017-03-04 LAB — OB RESULTS CONSOLE ANTIBODY SCREEN: Antibody Screen: NEGATIVE

## 2017-03-04 LAB — OB RESULTS CONSOLE HIV ANTIBODY (ROUTINE TESTING): HIV: NONREACTIVE

## 2017-07-15 ENCOUNTER — Other Ambulatory Visit: Payer: Self-pay | Admitting: Obstetrics and Gynecology

## 2017-07-26 ENCOUNTER — Inpatient Hospital Stay (HOSPITAL_COMMUNITY)
Admission: AD | Admit: 2017-07-26 | Discharge: 2017-07-26 | Disposition: A | Discharge status: Home / Self Care | Payer: Medicaid Other | Source: Ambulatory Visit | Attending: Obstetrics and Gynecology | Admitting: Obstetrics and Gynecology

## 2017-07-26 ENCOUNTER — Inpatient Hospital Stay (HOSPITAL_BASED_OUTPATIENT_CLINIC_OR_DEPARTMENT_OTHER): Payer: Medicaid Other

## 2017-07-26 ENCOUNTER — Encounter (HOSPITAL_COMMUNITY): Payer: Self-pay | Admitting: *Deleted

## 2017-07-26 DIAGNOSIS — O479 False labor, unspecified: Secondary | ICD-10-CM

## 2017-07-26 DIAGNOSIS — O4703 False labor before 37 completed weeks of gestation, third trimester: Secondary | ICD-10-CM

## 2017-07-26 DIAGNOSIS — Z3A30 30 weeks gestation of pregnancy: Secondary | ICD-10-CM

## 2017-07-26 DIAGNOSIS — Z3686 Encounter for antenatal screening for cervical length: Secondary | ICD-10-CM | POA: Insufficient documentation

## 2017-07-26 LAB — WET PREP, GENITAL
Clue Cells Wet Prep HPF POC: NONE SEEN
Sperm: NONE SEEN
Trich, Wet Prep: NONE SEEN
Yeast Wet Prep HPF POC: NONE SEEN

## 2017-07-26 LAB — URINALYSIS, ROUTINE W REFLEX MICROSCOPIC
Bilirubin Urine: NEGATIVE
Glucose, UA: NEGATIVE mg/dL
Ketones, ur: NEGATIVE mg/dL
Leukocytes, UA: NEGATIVE
Nitrite: NEGATIVE
PH: 6 (ref 5.0–8.0)
Protein, ur: NEGATIVE mg/dL
SPECIFIC GRAVITY, URINE: 1.014 (ref 1.005–1.030)

## 2017-07-26 LAB — FETAL FIBRONECTIN: Fetal Fibronectin: NEGATIVE

## 2017-07-26 LAB — AMNISURE RUPTURE OF MEMBRANE (ROM) NOT AT ARMC: Amnisure ROM: NEGATIVE

## 2017-07-26 MED ORDER — NIFEDIPINE 10 MG PO CAPS
10.0000 mg | ORAL_CAPSULE | Freq: Once | ORAL | Status: AC
  Administered 2017-07-26: 10 mg via ORAL
  Filled 2017-07-26: qty 1

## 2017-07-26 MED ORDER — LACTATED RINGERS IV BOLUS
1000.0000 mL | Freq: Once | INTRAVENOUS | Status: AC
  Administered 2017-07-26: 1000 mL via INTRAVENOUS

## 2017-07-26 MED ORDER — OXYCODONE-ACETAMINOPHEN 5-325 MG PO TABS
1.0000 | ORAL_TABLET | Freq: Once | ORAL | Status: AC
  Administered 2017-07-26: 1 via ORAL
  Filled 2017-07-26: qty 1

## 2017-07-26 MED ORDER — CYCLOBENZAPRINE HCL 5 MG PO TABS
5.0000 mg | ORAL_TABLET | Freq: Once | ORAL | Status: AC
  Administered 2017-07-26: 5 mg via ORAL
  Filled 2017-07-26: qty 1

## 2017-07-26 NOTE — Discharge Instructions (Signed)
Braxton Hicks Contractions °Contractions of the uterus can occur throughout pregnancy, but they are not always a sign that you are in labor. You may have practice contractions called Braxton Hicks contractions. These false labor contractions are sometimes confused with true labor. °What are Braxton Hicks contractions? °Braxton Hicks contractions are tightening movements that occur in the muscles of the uterus before labor. Unlike true labor contractions, these contractions do not result in opening (dilation) and thinning of the cervix. Toward the end of pregnancy (32-34 weeks), Braxton Hicks contractions can happen more often and may become stronger. These contractions are sometimes difficult to tell apart from true labor because they can be very uncomfortable. You should not feel embarrassed if you go to the hospital with false labor. °Sometimes, the only way to tell if you are in true labor is for your health care provider to look for changes in the cervix. The health care provider will do a physical exam and may monitor your contractions. If you are not in true labor, the exam should show that your cervix is not dilating and your water has not broken. °If there are other health problems associated with your pregnancy, it is completely safe for you to be sent home with false labor. You may continue to have Braxton Hicks contractions until you go into true labor. °How to tell the difference between true labor and false labor °True labor °· Contractions last 30-70 seconds. °· Contractions become very regular. °· Discomfort is usually felt in the top of the uterus, and it spreads to the lower abdomen and low back. °· Contractions do not go away with walking. °· Contractions usually become more intense and increase in frequency. °· The cervix dilates and gets thinner. °False labor °· Contractions are usually shorter and not as strong as true labor contractions. °· Contractions are usually irregular. °· Contractions  are often felt in the front of the lower abdomen and in the groin. °· Contractions may go away when you walk around or change positions while lying down. °· Contractions get weaker and are shorter-lasting as time goes on. °· The cervix usually does not dilate or become thin. °Follow these instructions at home: °· Take over-the-counter and prescription medicines only as told by your health care provider. °· Keep up with your usual exercises and follow other instructions from your health care provider. °· Eat and drink lightly if you think you are going into labor. °· If Braxton Hicks contractions are making you uncomfortable: °? Change your position from lying down or resting to walking, or change from walking to resting. °? Sit and rest in a tub of warm water. °? Drink enough fluid to keep your urine pale yellow. Dehydration may cause these contractions. °? Do slow and deep breathing several times an hour. °· Keep all follow-up prenatal visits as told by your health care provider. This is important. °Contact a health care provider if: °· You have a fever. °· You have continuous pain in your abdomen. °Get help right away if: °· Your contractions become stronger, more regular, and closer together. °· You have fluid leaking or gushing from your vagina. °· You pass blood-tinged mucus (bloody show). °· You have bleeding from your vagina. °· You have low back pain that you never had before. °· You feel your baby’s head pushing down and causing pelvic pressure. °· Your baby is not moving inside you as much as it used to. °Summary °· Contractions that occur before labor are called Braxton   Hicks contractions, false labor, or practice contractions. °· Braxton Hicks contractions are usually shorter, weaker, farther apart, and less regular than true labor contractions. True labor contractions usually become progressively stronger and regular and they become more frequent. °· Manage discomfort from Braxton Hicks contractions by  changing position, resting in a warm bath, drinking plenty of water, or practicing deep breathing. °This information is not intended to replace advice given to you by your health care provider. Make sure you discuss any questions you have with your health care provider. °Document Released: 05/16/2016 Document Revised: 05/16/2016 Document Reviewed: 05/16/2016 °Elsevier Interactive Patient Education © 2018 [employer] ° °

## 2017-07-26 NOTE — MAU Note (Signed)
+  contractions Started yesterday Woke up today and felt strong and worse; more like menstrual cramps Rating pain 4/10  +clear watery discharge  +FM

## 2017-07-28 LAB — GC/CHLAMYDIA PROBE AMP (~~LOC~~) NOT AT ARMC
Chlamydia: NEGATIVE
Neisseria Gonorrhea: NEGATIVE

## 2017-09-12 ENCOUNTER — Telehealth (HOSPITAL_COMMUNITY): Payer: Self-pay | Admitting: *Deleted

## 2017-09-12 NOTE — Telephone Encounter (Signed)
Preadmission screen  

## 2017-09-16 ENCOUNTER — Telehealth (HOSPITAL_COMMUNITY): Payer: Self-pay | Admitting: *Deleted

## 2017-09-16 NOTE — Telephone Encounter (Signed)
Preadmission screen  

## 2017-09-17 ENCOUNTER — Encounter (HOSPITAL_COMMUNITY): Payer: Self-pay

## 2017-09-25 ENCOUNTER — Encounter (HOSPITAL_COMMUNITY)
Admission: RE | Admit: 2017-09-25 | Discharge: 2017-09-25 | Disposition: A | Discharge status: Home / Self Care | Payer: Medicaid Other | Source: Ambulatory Visit | Attending: Obstetrics and Gynecology | Admitting: Obstetrics and Gynecology

## 2017-09-25 LAB — TYPE AND SCREEN
ABO/RH(D): A NEG
ANTIBODY SCREEN: NEGATIVE

## 2017-09-25 LAB — CBC
HCT: 32.6 % — ABNORMAL LOW (ref 36.0–46.0)
Hemoglobin: 10.7 g/dL — ABNORMAL LOW (ref 12.0–15.0)
MCH: 30.3 pg (ref 26.0–34.0)
MCHC: 32.8 g/dL (ref 30.0–36.0)
MCV: 92.4 fL (ref 78.0–100.0)
Platelets: 237 10*3/uL (ref 150–400)
RBC: 3.53 MIL/uL — AB (ref 3.87–5.11)
RDW: 13.3 % (ref 11.5–15.5)
WBC: 8.2 10*3/uL (ref 4.0–10.5)

## 2017-09-25 NOTE — Patient Instructions (Signed)
Frances FurbishMorgan P Whitcher  09/25/2017   Your procedure is scheduled on:  09/26/2017  [facility] through the [facility]tr[facility] of [facility] at 0730 AM.  Pick up the phone at the desk and dial [phone]  Call this number if you have problems the morning of [phone]  Remember:   Do not eat food:(After Midnight) Desps de medianoche.  Do not drink clear liquids: (After Midnight) Desps de medianoche.  Take these medicines the morning of surgery with A SIP OF WATER: none   Do not wear jewelry, make-up or nail polish.  Do not wear lotions, powders, or perfumes. Do not wear deodorant.  Do not shave 48 hours prior to surgery.  Do not bring valuables to the hospital.  [facility] is not   responsible for any belongings or valuables brought to the hospital.  Contacts, dentures or bridgework may not be worn into surgery.  Leave suitcase in the car. After surgery it may be brought to your room.  For patients admitted to the hospital, checkout time is 11:00 AM the day of              discharge.    N/A   Please read over the following fact sheets that you were given:   Surgical Site Infection Prevention

## 2017-09-26 ENCOUNTER — Inpatient Hospital Stay (HOSPITAL_COMMUNITY): Payer: Medicaid Other

## 2017-09-26 ENCOUNTER — Encounter (HOSPITAL_COMMUNITY): Payer: Self-pay | Admitting: *Deleted

## 2017-09-26 ENCOUNTER — Encounter (HOSPITAL_COMMUNITY)
Admission: AD | Disposition: A | Discharge status: Home / Self Care | Payer: Self-pay | Source: Home / Self Care | Attending: Obstetrics and Gynecology

## 2017-09-26 ENCOUNTER — Inpatient Hospital Stay (HOSPITAL_COMMUNITY)
Admission: AD | Admit: 2017-09-26 | Discharge: 2017-09-28 | DRG: 788 | Disposition: A | Discharge status: Home / Self Care | Payer: Medicaid Other | Attending: Obstetrics and Gynecology | Admitting: Obstetrics and Gynecology

## 2017-09-26 DIAGNOSIS — Z3A39 39 weeks gestation of pregnancy: Secondary | ICD-10-CM | POA: Diagnosis not present

## 2017-09-26 DIAGNOSIS — O34211 Maternal care for low transverse scar from previous cesarean delivery: Principal | ICD-10-CM | POA: Diagnosis present

## 2017-09-26 DIAGNOSIS — O26893 Other specified pregnancy related conditions, third trimester: Secondary | ICD-10-CM | POA: Diagnosis present

## 2017-09-26 DIAGNOSIS — Z23 Encounter for immunization: Secondary | ICD-10-CM | POA: Diagnosis not present

## 2017-09-26 DIAGNOSIS — O9081 Anemia of the puerperium: Secondary | ICD-10-CM | POA: Diagnosis not present

## 2017-09-26 DIAGNOSIS — Z6791 Unspecified blood type, Rh negative: Secondary | ICD-10-CM | POA: Diagnosis not present

## 2017-09-26 LAB — CBC
HCT: 32.6 % — ABNORMAL LOW (ref 36.0–46.0)
Hemoglobin: 11.2 g/dL — ABNORMAL LOW (ref 12.0–15.0)
MCH: 31.6 pg (ref 26.0–34.0)
MCHC: 34.4 g/dL (ref 30.0–36.0)
MCV: 92.1 fL (ref 78.0–100.0)
PLATELETS: 232 10*3/uL (ref 150–400)
RBC: 3.54 MIL/uL — AB (ref 3.87–5.11)
RDW: 13.2 % (ref 11.5–15.5)
WBC: 14.9 10*3/uL — AB (ref 4.0–10.5)

## 2017-09-26 LAB — CREATININE, SERUM: Creatinine, Ser: 0.62 mg/dL (ref 0.44–1.00)

## 2017-09-26 LAB — RPR: RPR Ser Ql: NONREACTIVE

## 2017-09-26 SURGERY — Surgical Case
Anesthesia: Spinal | Site: Abdomen | Wound class: Clean Contaminated

## 2017-09-26 MED ORDER — BUPIVACAINE HCL (PF) 0.25 % IJ SOLN
INTRAMUSCULAR | Status: DC | PRN
  Administered 2017-09-26: 20 mL

## 2017-09-26 MED ORDER — KETOROLAC TROMETHAMINE 30 MG/ML IJ SOLN
30.0000 mg | Freq: Once | INTRAMUSCULAR | Status: AC | PRN
  Administered 2017-09-26: 30 mg via INTRAVENOUS

## 2017-09-26 MED ORDER — OXYTOCIN 10 UNIT/ML IJ SOLN
INTRAMUSCULAR | Status: AC
  Filled 2017-09-26: qty 4

## 2017-09-26 MED ORDER — PHENYLEPHRINE 40 MCG/ML (10ML) SYRINGE FOR IV PUSH (FOR BLOOD PRESSURE SUPPORT)
PREFILLED_SYRINGE | INTRAVENOUS | Status: AC
  Filled 2017-09-26: qty 10

## 2017-09-26 MED ORDER — FERROUS SULFATE 325 (65 FE) MG PO TABS
325.0000 mg | ORAL_TABLET | Freq: Two times a day (BID) | ORAL | Status: DC
  Administered 2017-09-26 – 2017-09-28 (×4): 325 mg via ORAL
  Filled 2017-09-26 (×4): qty 1

## 2017-09-26 MED ORDER — WITCH HAZEL-GLYCERIN EX PADS: 1.0000 "application " | MEDICATED_PAD | CUTANEOUS | Status: DC | PRN

## 2017-09-26 MED ORDER — KETOROLAC TROMETHAMINE 30 MG/ML IJ SOLN
INTRAMUSCULAR | Status: AC
  Filled 2017-09-26: qty 1

## 2017-09-26 MED ORDER — ACETAMINOPHEN 325 MG PO TABS: 650.0000 mg | ORAL_TABLET | ORAL | Status: DC | PRN

## 2017-09-26 MED ORDER — ONDANSETRON HCL 4 MG/2ML IJ SOLN
4.0000 mg | Freq: Four times a day (QID) | INTRAMUSCULAR | Status: DC | PRN
  Administered 2017-09-26: 4 mg via INTRAVENOUS
  Filled 2017-09-26: qty 2

## 2017-09-26 MED ORDER — COCONUT OIL OIL
1.0000 "application " | TOPICAL_OIL | Status: DC | PRN
  Administered 2017-09-27: 1 via TOPICAL
  Filled 2017-09-26: qty 120

## 2017-09-26 MED ORDER — TETANUS-DIPHTH-ACELL PERTUSSIS 5-2.5-18.5 LF-MCG/0.5 IM SUSP: 0.5000 mL | Freq: Once | INTRAMUSCULAR | Status: DC

## 2017-09-26 MED ORDER — DEXAMETHASONE SODIUM PHOSPHATE 4 MG/ML IJ SOLN
INTRAMUSCULAR | Status: AC
  Filled 2017-09-26: qty 1

## 2017-09-26 MED ORDER — ONDANSETRON HCL 4 MG/2ML IJ SOLN
INTRAMUSCULAR | Status: AC
  Filled 2017-09-26: qty 2

## 2017-09-26 MED ORDER — BUPIVACAINE HCL (PF) 0.25 % IJ SOLN
INTRAMUSCULAR | Status: AC
  Filled 2017-09-26: qty 30

## 2017-09-26 MED ORDER — ONDANSETRON HCL 4 MG/2ML IJ SOLN: 4.0000 mg | Freq: Three times a day (TID) | INTRAMUSCULAR | Status: DC | PRN

## 2017-09-26 MED ORDER — PRENATAL MULTIVITAMIN CH
1.0000 | ORAL_TABLET | Freq: Every day | ORAL | Status: DC
  Administered 2017-09-27 – 2017-09-28 (×2): 1 via ORAL
  Filled 2017-09-26 (×2): qty 1

## 2017-09-26 MED ORDER — BUPIVACAINE IN DEXTROSE 0.75-8.25 % IT SOLN: INTRATHECAL | Status: DC | PRN

## 2017-09-26 MED ORDER — MORPHINE SULFATE (PF) 0.5 MG/ML IJ SOLN
INTRAMUSCULAR | Status: AC
  Filled 2017-09-26: qty 10

## 2017-09-26 MED ORDER — ENOXAPARIN SODIUM 60 MG/0.6ML ~~LOC~~ SOLN
50.0000 mg | SUBCUTANEOUS | Status: DC
  Administered 2017-09-27 – 2017-09-28 (×2): 50 mg via SUBCUTANEOUS
  Filled 2017-09-26 (×2): qty 0.6

## 2017-09-26 MED ORDER — PHENYLEPHRINE 40 MCG/ML (10ML) SYRINGE FOR IV PUSH (FOR BLOOD PRESSURE SUPPORT)
PREFILLED_SYRINGE | INTRAVENOUS | Status: DC | PRN
  Administered 2017-09-26: 80 ug via INTRAVENOUS

## 2017-09-26 MED ORDER — SODIUM CHLORIDE 0.9 % IR SOLN
Status: DC | PRN
  Administered 2017-09-26 (×2): 1000 mL

## 2017-09-26 MED ORDER — MENTHOL 3 MG MT LOZG: 1.0000 | LOZENGE | OROMUCOSAL | Status: DC | PRN

## 2017-09-26 MED ORDER — SIMETHICONE 80 MG PO CHEW
80.0000 mg | CHEWABLE_TABLET | ORAL | Status: DC
  Administered 2017-09-27 (×2): 80 mg via ORAL
  Filled 2017-09-26 (×2): qty 1

## 2017-09-26 MED ORDER — SCOPOLAMINE 1 MG/3DAYS TD PT72
1.0000 | MEDICATED_PATCH | TRANSDERMAL | Status: DC
  Filled 2017-09-26: qty 1

## 2017-09-26 MED ORDER — FENTANYL CITRATE (PF) 100 MCG/2ML IJ SOLN
INTRAMUSCULAR | Status: AC
  Filled 2017-09-26: qty 2

## 2017-09-26 MED ORDER — DIBUCAINE 1 % RE OINT: 1.0000 "application " | TOPICAL_OINTMENT | RECTAL | Status: DC | PRN

## 2017-09-26 MED ORDER — METHYLERGONOVINE MALEATE 0.2 MG PO TABS: 0.2000 mg | ORAL_TABLET | ORAL | Status: DC | PRN

## 2017-09-26 MED ORDER — DEXAMETHASONE SODIUM PHOSPHATE 10 MG/ML IJ SOLN
INTRAMUSCULAR | Status: DC | PRN
  Administered 2017-09-26: 4 mg via INTRAVENOUS

## 2017-09-26 MED ORDER — SODIUM CHLORIDE 0.9 % IV SOLN: INTRAVENOUS | Status: DC | PRN

## 2017-09-26 MED ORDER — LACTATED RINGERS IV SOLN
INTRAVENOUS | Status: DC
  Administered 2017-09-26 (×3): via INTRAVENOUS

## 2017-09-26 MED ORDER — OXYTOCIN 40 UNITS IN LACTATED RINGERS INFUSION - SIMPLE MED: 2.5000 [IU]/h | INTRAVENOUS | Status: AC

## 2017-09-26 MED ORDER — LACTATED RINGERS IV SOLN
INTRAVENOUS | Status: DC | PRN
  Administered 2017-09-26: 10:00:00 via INTRAVENOUS

## 2017-09-26 MED ORDER — ONDANSETRON HCL 4 MG/2ML IJ SOLN
INTRAMUSCULAR | Status: DC | PRN
  Administered 2017-09-26: 4 mg via INTRAVENOUS

## 2017-09-26 MED ORDER — PHENYLEPHRINE 8 MG IN D5W 100 ML (0.08MG/ML) PREMIX OPTIME
INJECTION | INTRAVENOUS | Status: AC
  Filled 2017-09-26: qty 100

## 2017-09-26 MED ORDER — OXYTOCIN 10 UNIT/ML IJ SOLN
INTRAVENOUS | Status: DC | PRN
  Administered 2017-09-26: 40 [IU] via INTRAVENOUS

## 2017-09-26 MED ORDER — SENNOSIDES-DOCUSATE SODIUM 8.6-50 MG PO TABS
2.0000 | ORAL_TABLET | ORAL | Status: DC
  Administered 2017-09-27 (×2): 2 via ORAL
  Filled 2017-09-26 (×2): qty 2

## 2017-09-26 MED ORDER — MEASLES, MUMPS & RUBELLA VAC ~~LOC~~ INJ
0.5000 mL | INJECTION | Freq: Once | SUBCUTANEOUS | Status: DC
  Filled 2017-09-26: qty 0.5

## 2017-09-26 MED ORDER — SIMETHICONE 80 MG PO CHEW
80.0000 mg | CHEWABLE_TABLET | Freq: Three times a day (TID) | ORAL | Status: DC
  Administered 2017-09-26 – 2017-09-28 (×4): 80 mg via ORAL
  Filled 2017-09-26 (×5): qty 1

## 2017-09-26 MED ORDER — PHENYLEPHRINE 8 MG IN D5W 100 ML (0.08MG/ML) PREMIX OPTIME
INJECTION | INTRAVENOUS | Status: DC | PRN
  Administered 2017-09-26: 60 ug/min via INTRAVENOUS

## 2017-09-26 MED ORDER — INFLUENZA VAC SPLIT QUAD 0.5 ML IM SUSY
0.5000 mL | PREFILLED_SYRINGE | INTRAMUSCULAR | Status: AC
  Administered 2017-09-28: 0.5 mL via INTRAMUSCULAR
  Filled 2017-09-26: qty 0.5

## 2017-09-26 MED ORDER — FENTANYL CITRATE (PF) 100 MCG/2ML IJ SOLN
INTRAMUSCULAR | Status: DC | PRN
  Administered 2017-09-26: 15 ug via EPIDURAL

## 2017-09-26 MED ORDER — PROMETHAZINE HCL 25 MG/ML IJ SOLN
12.5000 mg | Freq: Three times a day (TID) | INTRAMUSCULAR | Status: DC | PRN
  Administered 2017-09-26: 12.5 mg via INTRAVENOUS
  Filled 2017-09-26: qty 1

## 2017-09-26 MED ORDER — SIMETHICONE 80 MG PO CHEW
80.0000 mg | CHEWABLE_TABLET | ORAL | Status: DC | PRN
  Administered 2017-09-27: 80 mg via ORAL

## 2017-09-26 MED ORDER — METHYLERGONOVINE MALEATE 0.2 MG/ML IJ SOLN: 0.2000 mg | INTRAMUSCULAR | Status: DC | PRN

## 2017-09-26 MED ORDER — ZOLPIDEM TARTRATE 5 MG PO TABS: 5.0000 mg | ORAL_TABLET | Freq: Every evening | ORAL | Status: DC | PRN

## 2017-09-26 MED ORDER — CEFAZOLIN SODIUM-DEXTROSE 2-4 GM/100ML-% IV SOLN
2.0000 g | INTRAVENOUS | Status: AC
  Administered 2017-09-26: 2 g via INTRAVENOUS
  Filled 2017-09-26: qty 100

## 2017-09-26 MED ORDER — BUPIVACAINE IN DEXTROSE 0.75-8.25 % IT SOLN
INTRATHECAL | Status: DC | PRN
  Administered 2017-09-26: 13.5 mg via INTRATHECAL

## 2017-09-26 MED ORDER — IBUPROFEN 600 MG PO TABS
600.0000 mg | ORAL_TABLET | Freq: Four times a day (QID) | ORAL | Status: DC
  Administered 2017-09-26 – 2017-09-28 (×8): 600 mg via ORAL
  Filled 2017-09-26 (×8): qty 1

## 2017-09-26 MED ORDER — MORPHINE SULFATE-NACL 0.5-0.9 MG/ML-% IV SOSY
PREFILLED_SYRINGE | INTRAVENOUS | Status: DC | PRN
  Administered 2017-09-26: .15 mg via EPIDURAL

## 2017-09-26 MED ORDER — DIPHENHYDRAMINE HCL 25 MG PO CAPS: 25.0000 mg | ORAL_CAPSULE | Freq: Four times a day (QID) | ORAL | Status: DC | PRN

## 2017-09-26 SURGICAL SUPPLY — 41 items
BARRIER ADHS 3X4 INTERCEED (GAUZE/BANDAGES/DRESSINGS) ×2 IMPLANT
BENZOIN TINCTURE PRP APPL 2/3 (GAUZE/BANDAGES/DRESSINGS) ×2 IMPLANT
BOOTIES KNEE HIGH SLOAN (MISCELLANEOUS) ×4 IMPLANT
CHLORAPREP W/TINT 26ML (MISCELLANEOUS) ×2 IMPLANT
CLAMP CORD UMBIL (MISCELLANEOUS) IMPLANT
CLOTH BEACON ORANGE TIMEOUT ST (SAFETY) ×2 IMPLANT
DRAIN JACKSON PRT FLT 10 (DRAIN) IMPLANT
DRSG OPSITE POSTOP 4X10 (GAUZE/BANDAGES/DRESSINGS) ×2 IMPLANT
ELECT REM PT RETURN 9FT ADLT (ELECTROSURGICAL) ×2
ELECTRODE REM PT RTRN 9FT ADLT (ELECTROSURGICAL) ×1 IMPLANT
EVACUATOR SILICONE 100CC (DRAIN) IMPLANT
EXTRACTOR VACUUM M CUP 4 TUBE (SUCTIONS) IMPLANT
GAUZE SPONGE 4X4 12PLY STRL LF (GAUZE/BANDAGES/DRESSINGS) ×2 IMPLANT
GLOVE BIOGEL PI IND STRL 7.0 (GLOVE) ×2 IMPLANT
GLOVE BIOGEL PI INDICATOR 7.0 (GLOVE) ×2
GLOVE ECLIPSE 6.5 STRL STRAW (GLOVE) ×2 IMPLANT
GOWN STRL REUS W/TWL LRG LVL3 (GOWN DISPOSABLE) ×4 IMPLANT
KIT ABG SYR 3ML LUER SLIP (SYRINGE) IMPLANT
NEEDLE HYPO 22GX1.5 SAFETY (NEEDLE) ×2 IMPLANT
NEEDLE HYPO 25X5/8 SAFETYGLIDE (NEEDLE) IMPLANT
NS IRRIG 1000ML POUR BTL (IV SOLUTION) ×4 IMPLANT
PACK C SECTION WH (CUSTOM PROCEDURE TRAY) ×2 IMPLANT
PAD ABD 7.5X8 STRL (GAUZE/BANDAGES/DRESSINGS) ×2 IMPLANT
PAD ABD 8X10 STRL (GAUZE/BANDAGES/DRESSINGS) ×2 IMPLANT
PAD OB MATERNITY 4.3X12.25 (PERSONAL CARE ITEMS) ×2 IMPLANT
PENCIL SMOKE EVAC W/HOLSTER (ELECTROSURGICAL) ×2 IMPLANT
RTRCTR C-SECT PINK 25CM LRG (MISCELLANEOUS) ×2 IMPLANT
SPONGE LAP 18X18 RF (DISPOSABLE) ×2 IMPLANT
SPONGE LAP 18X18 X RAY DECT (DISPOSABLE) ×2 IMPLANT
STRIP CLOSURE SKIN 1/2X4 (GAUZE/BANDAGES/DRESSINGS) ×2 IMPLANT
SUT MNCRL AB 3-0 PS2 27 (SUTURE) ×2 IMPLANT
SUT PLAIN 2 0 XLH (SUTURE) ×2 IMPLANT
SUT SILK 2 0 FSL 18 (SUTURE) IMPLANT
SUT VIC AB 0 CTX 36 (SUTURE) ×4
SUT VIC AB 0 CTX36XBRD ANBCTRL (SUTURE) ×4 IMPLANT
SUT VIC AB 1 CT1 36 (SUTURE) ×4 IMPLANT
SUT VIC AB 2-0 CT1 27 (SUTURE)
SUT VIC AB 2-0 CT1 TAPERPNT 27 (SUTURE) IMPLANT
SYR 20CC LL (SYRINGE) ×2 IMPLANT
TOWEL OR 17X24 6PK STRL BLUE (TOWEL DISPOSABLE) ×2 IMPLANT
TRAY FOLEY W/BAG SLVR 14FR LF (SET/KITS/TRAYS/PACK) ×2 IMPLANT

## 2017-09-26 NOTE — Addendum Note (Signed)
Addendum  created 09/26/17 1836 by Elgie CongoMalinova, Jachai Okazaki H, CRNA   Sign clinical note

## 2017-09-26 NOTE — Progress Notes (Deleted)
Called CNM about pt still being nauseas when she turns her head, asked CNM if it could be coming from scopolamine patch b/c IV Zofran did not work. CNM adv me to call anesthesia to see if they are ok with removing patch. Spoke to Dr Armond HangWoodrum (anestheiologist) and she said to go ahead and remove the patch from pt.

## 2017-09-26 NOTE — Anesthesia Postprocedure Evaluation (Signed)
Anesthesia Post Note  Patient: Katherine Riggs  Procedure(s) Performed: REPEAT CESAREAN SECTION (N/A Abdomen)     Patient location during evaluation: PACU Anesthesia Type: Spinal Level of consciousness: oriented and awake and alert Pain management: pain level controlled Vital Signs Assessment: post-procedure vital signs reviewed and stable Respiratory status: spontaneous breathing, respiratory function stable and patient connected to nasal cannula oxygen Cardiovascular status: blood pressure returned to baseline and stable Postop Assessment: no headache, no backache and no apparent nausea or vomiting Anesthetic complications: no    Last Vitals:  Vitals:   09/26/17 1455 09/26/17 1555  BP:  121/73  Pulse: 64 67  Resp: 18 18  Temp: (!) 36.3 C 36.6 C  SpO2: 97% 96%    Last Pain:  Vitals:   09/26/17 1555  TempSrc: Axillary   Pain Goal:                 Zayd Bonet L Javani Spratt

## 2017-09-26 NOTE — Anesthesia Postprocedure Evaluation (Signed)
Anesthesia Post Note  Patient: Katherine FurbishMorgan P Riggs  Procedure(s) Performed: REPEAT CESAREAN SECTION (N/A Abdomen)     Patient location during evaluation: Mother Baby Anesthesia Type: Spinal Level of consciousness: awake and alert Pain management: pain level controlled Vital Signs Assessment: post-procedure vital signs reviewed and stable Respiratory status: spontaneous breathing, nonlabored ventilation and respiratory function stable Cardiovascular status: stable Postop Assessment: no headache, no backache, able to ambulate, spinal receding, adequate PO intake, no apparent nausea or vomiting and patient able to bend at knees Anesthetic complications: no    Last Vitals:  Vitals:   09/26/17 1455 09/26/17 1555  BP:  121/73  Pulse: 64 67  Resp: 18 18  Temp: (!) 36.3 C 36.6 C  SpO2: 97% 96%    Last Pain:  Vitals:   09/26/17 1555  TempSrc: Axillary   Pain Goal:                 Land O'LakesMalinova,Aarin Sparkman Hristova

## 2017-09-26 NOTE — Transfer of Care (Signed)
Immediate Anesthesia Transfer of Care Note  Patient: Katherine Riggs  Procedure(s) Performed: REPEAT CESAREAN SECTION (N/A Abdomen)  Patient Location: PACU  Anesthesia Type:Spinal  Level of Consciousness: awake  Airway & Oxygen Therapy: Patient Spontanous Breathing  Post-op Assessment: Report given to RN  Post vital signs: Reviewed and stable  Last Vitals:  Vitals Value Taken Time  BP 116/62 09/26/2017 11:23 AM  Temp    Pulse 73 09/26/2017 11:24 AM  Resp 17 09/26/2017 11:24 AM  SpO2 95 % 09/26/2017 11:24 AM  Vitals shown include unvalidated device data.  Last Pain:  Vitals:   09/26/17 0737  TempSrc: Oral         Complications: No apparent anesthesia complications

## 2017-09-26 NOTE — Anesthesia Procedure Notes (Addendum)
Spinal  Patient location during procedure: OR Start time: 09/26/2017 9:45 AM End time: 09/26/2017 9:48 AM Staffing Anesthesiologist: Elmer PickerWoodrum, Sigourney Portillo L, MD Other anesthesia staff: Marcial PacasGervais, Antonio S, RN Performed: other anesthesia staff  Preanesthetic Checklist Completed: patient identified, site marked, surgical consent, pre-op evaluation, timeout performed, IV checked, risks and benefits discussed and monitors and equipment checked Spinal Block Patient position: sitting Prep: site prepped and draped and DuraPrep Patient monitoring: heart rate, continuous pulse ox and blood pressure Approach: midline Location: L3-4 Injection technique: single-shot Needle Needle type: Pencan  Needle gauge: 24 G Needle length: 10 cm Needle insertion depth: 7 cm Assessment Sensory level: T4

## 2017-09-26 NOTE — Op Note (Signed)
Preoperative diagnosis: Intrauterine pregnancy at 4239 with 3 previous cesarean sections  Post operative diagnosis: Same  Anesthesia: Spinal  Anesthesiologist: Dr. Arby BarretteHatchett  Procedure: Repeat low transverse cesarean section  Surgeon: Dr. Dois DavenportSandra Lorrinda Ramstad  Assistant: Kathalene FramesEllis Greer CNM  Quantitated blood loss: 869 cc  Procedure:  After being informed of the planned procedure and possible complications including bleeding, infection, injury to other organs, informed consent is obtained. The patient is taken to OR #9 and given spinal anesthesia without complication. She is placed in the dorsal decubitus position with the pelvis tilted to the left. She is then prepped and draped in a sterile fashion. A Foley catheter is inserted in her bladder.  After assessing adequate level of anesthesia, we infiltrate the suprapubic area with 20 cc of Marcaine 0.25 and perform a Pfannenstiel incision which is brought down sharply to the fascia. The fascia is entered in a low transverse fashion. Linea alba is dissected.We encounter adhesions between the right uterine body and the abdominal wall which are taken down sharply and with cauterization. Peritoneum is entered in a midline fashion. An Alexis retractor is easily positioned.   We dissect the bladder down sharply. The myometrium is then entered in a low transverse fashion ; first with knife and then extended bluntly. Amniotic fluid is clear. We assist the birth of a female  infant in vertex presentation. Mouth and nose are suctioned. The baby is delivered. The cord is clamped and sectioned. The baby is given to the neonatologist present in the room.  10 cc of blood is drawn from the umbilical vein.The placenta is allowed to deliver spontaneously. It is complete and the cord has 2 vessels. Uterine revision is negative.  We proceed with closure of the myometrium in 2 layers: First with a running locked suture of 0 Vicryl, then with a Lembert suture of 0 Vicryl  imbricating the first one. Hemostasis is completed with cauterization on peritoneal edges. The myometrial defect on the right side from adhesiolysis is repaired with figure-of-eight sutures.  Both paracolic gutters are cleaned. Both tubes and ovaries are assessed and normal. The pelvis is profusely irrigated with warm saline to confirm a satisfactory hemostasis.A sheet of Interceed is placed on the hysterotomy and right repaired defect.   Retractors and sponges are removed. Under fascia hemostasis is completed with cauterization. The fascia is then closed with 2 running sutures of 0 Vicryl meeting midline. The wound is irrigated with warm saline and hemostasis is completed with cauterization. The subcuticular layer is closed with 0-Plain sutures.The skin is closed with a subcuticular suture of 3-0 Monocryl and Steri-Strips.  Instrument and sponge count is complete x2. Quantitated blood loss is 869 cc.  The procedure is well tolerated by the patient who is taken to recovery room in a well and stable condition.  female baby named Maverick was born at 10:20 and received an Apgar of 8  at 1 minute and 9 at 5 minutes.    Specimen: Placenta sent to L & D   Crist FatSandra A Jaleya Pebley MD 9/13/201911:25 AM

## 2017-09-26 NOTE — Anesthesia Preprocedure Evaluation (Signed)
Anesthesia Evaluation  Patient identified by MRN, date of birth, ID band Patient awake    Reviewed: Allergy & Precautions, NPO status , Patient's Chart, lab work & pertinent test results  Airway Mallampati: I  TM Distance: >3 FB Neck ROM: Full    Dental no notable dental hx. (+) Teeth Intact, Dental Advisory Given   Pulmonary neg pulmonary ROS,    Pulmonary exam normal breath sounds clear to auscultation       Cardiovascular Normal cardiovascular exam Rhythm:Regular Rate:Normal     Neuro/Psych negative neurological ROS  negative psych ROS   GI/Hepatic negative GI ROS, Neg liver ROS,   Endo/Other  negative endocrine ROS  Renal/GU negative Renal ROS  negative genitourinary   Musculoskeletal negative musculoskeletal ROS (+)   Abdominal   Peds  Hematology negative hematology ROS (+)   Anesthesia Other Findings   Reproductive/Obstetrics (+) Pregnancy Repeat C/S x3                             Anesthesia Physical Anesthesia Plan  ASA: II  Anesthesia Plan: Spinal   Post-op Pain Management:    Induction:   PONV Risk Score and Plan: Treatment may vary due to age or medical condition  Airway Management Planned: Natural Airway  Additional Equipment:   Intra-op Plan:   Post-operative Plan:   Informed Consent: I have reviewed the patients History and Physical, chart, labs and discussed the procedure including the risks, benefits and alternatives for the proposed anesthesia with the patient or authorized representative who has indicated his/her understanding and acceptance.   Dental advisory given  Plan Discussed with: CRNA  Anesthesia Plan Comments:         Anesthesia Quick Evaluation

## 2017-09-26 NOTE — H&P (Signed)
Katherine Riggs is a 32 y.o. female presenting for repeat cesarean section. OB History    Gravida  6   Para  3   Term  3   Preterm      AB  2   Living  3     SAB      TAB  2   Ectopic      Multiple      Live Births  3          Past Medical History:  Diagnosis Date  . Abortion, elective or therapeutic 2011 and 2012  . Complication of anesthesia    high BP in PACU- resolved in PACU  . Cystitis 2012  . Hypertension, postpartum condition or complication 2008  . No pertinent past medical history   . Pyelonephritis 2012  . Status post repeat low transverse cesarean section 05/20/2012   Past Surgical History:  Procedure Laterality Date  . CESAREAN SECTION  12/2006   baby's head position  . CESAREAN SECTION  01/15/2011   Procedure: CESAREAN SECTION;  Surgeon: Michael LitterNaima A Dillard, MD;  Location: [facility];  Service: Gynecology;  Laterality: N/A;  . CESAREAN SECTION N/A 05/20/2012   Procedure: CESAREAN SECTION REPEAT ;  Surgeon: Michael LitterNaima A Dillard, MD;  Location: [facility];  Service: Obstetrics;  Laterality: N/A;  Repeat   Family History: family history includes ADD / ADHD in her son; Asthma in her brother; Cancer in her maternal grandmother; Diabetes in her paternal aunt and paternal grandmother; Heart attack in her paternal grandmother; Hypertension in her paternal grandfather and paternal grandmother; Migraines in her mother; ODD in her son. Social History:  reports that she has never smoked. She has never used smokeless tobacco. She reports that she does not drink alcohol or use drugs.     Maternal Diabetes: No Genetic Screening: Normal Maternal Ultrasounds/Referrals: Normal Fetal Ultrasounds or other Referrals:  Other: single umbilical artery, weekly BPPs from 32 weeks Maternal Substance Abuse:  No Significant Maternal Medications:  None Significant Maternal Lab Results:  Lab values include: Rh negative Other Comments:  None  Review of Systems  All other systems reviewed and  are negative.  Maternal Medical History:  Fetal activity: Perceived fetal activity is normal.   Last perceived fetal movement was within the past hour.    Prenatal Complications - Diabetes: none.       Vitals:   09/26/17 0731 09/26/17 0737  BP:  127/76  Pulse:  82  Resp:  18  Temp:  97.7 F (36.5 C)  TempSrc:  Oral  Weight: 98.4 kg   Height: 5\' 1"  (1.549 m)      Exam Physical Exam  Nursing note and vitals reviewed. Constitutional: She is oriented to person, place, and time. She appears well-developed and well-nourished.  HENT:  Head: Normocephalic.  Eyes: Pupils are equal, round, and reactive to light.  Cardiovascular: Normal rate, regular rhythm and normal heart sounds.  Respiratory: Effort normal and breath sounds normal.  Musculoskeletal: Normal range of motion.  Neurological: She is alert and oriented to person, place, and time.  Skin: Skin is warm and dry.  Psychiatric: She has a normal mood and affect. Her behavior is normal. Judgment and thought content normal.    Prenatal labs: ABO, Rh: --/--/A NEG (09/12 1025) Antibody: NEG (09/12 1025) Rubella: Immune (02/19 0000) RPR: Non Reactive (09/12 1025)  HBsAg: Negative (02/19 0000)  HIV: Non-reactive (02/19 0000)  GBS:   Negative  Assessment/Plan: 32 y.o. G6P3 at 39w  Elective repeat cesarean section with Dr. Estanislado Pandy Admit to pre-op   Katherine Riggs 09/26/2017, 7:41 AM

## 2017-09-27 ENCOUNTER — Encounter (HOSPITAL_COMMUNITY): Payer: Self-pay | Admitting: Obstetrics and Gynecology

## 2017-09-27 LAB — CBC
HCT: 27.2 % — ABNORMAL LOW (ref 36.0–46.0)
HEMOGLOBIN: 9.1 g/dL — AB (ref 12.0–15.0)
MCH: 31 pg (ref 26.0–34.0)
MCHC: 33.5 g/dL (ref 30.0–36.0)
MCV: 92.5 fL (ref 78.0–100.0)
PLATELETS: 226 10*3/uL (ref 150–400)
RBC: 2.94 MIL/uL — AB (ref 3.87–5.11)
RDW: 13.4 % (ref 11.5–15.5)
WBC: 10.4 10*3/uL (ref 4.0–10.5)

## 2017-09-27 MED ORDER — RHO D IMMUNE GLOBULIN 1500 UNIT/2ML IJ SOSY
300.0000 ug | PREFILLED_SYRINGE | Freq: Once | INTRAMUSCULAR | Status: AC
  Administered 2017-09-28: 300 ug via INTRAMUSCULAR
  Filled 2017-09-27: qty 2

## 2017-09-27 NOTE — Progress Notes (Addendum)
Subjective: Postpartum Day 1: Cesarean Delivery Patient reports incisional pain, tolerating PO and no problems voiding.  Mild asymptomatic anemia.   Objective: Vitals:   09/26/17 2300 09/27/17 0100 09/27/17 0335 09/27/17 0539  BP:   102/79 119/66  Pulse:   68 84  Resp:   18 18  Temp:   98.3 F (36.8 C) 98.5 F (36.9 C)  TempSrc:   Oral Oral  SpO2: 97% 95% 96% 96%  Weight:      Height:        Physical Exam:  General: alert and cooperative Lochia: appropriate Uterine Fundus: firm Incision: healing well, no significant drainage, no dehiscence, no significant erythema DVT Evaluation: No evidence of DVT seen on physical exam. Negative Homan's sign. No cords or calf tenderness. No significant calf/ankle edema.  Recent Labs    09/26/17 1331 09/27/17 0634  HGB 11.2* 9.1*  HCT 32.6* 27.2*    Assessment/Plan: Status post Cesarean section. Doing well postoperatively.  Continue current care. Plans outpatient circumcision. Will continue PO Iron.   Janeece RiggersEllis K Jordynn Perrier 09/27/2017, 7:04 AM

## 2017-09-27 NOTE — Progress Notes (Signed)
Pt breastfeeding at this time.  Requested patient to notify this nurse when she is finished feeding so assessment can be done on her and infant.  Pt verbalized understanding. Denies needs at this time.

## 2017-09-27 NOTE — Progress Notes (Signed)
2nd void after catheter removed.

## 2017-09-28 DIAGNOSIS — O9081 Anemia of the puerperium: Secondary | ICD-10-CM | POA: Diagnosis not present

## 2017-09-28 MED ORDER — ACETAMINOPHEN 325 MG PO TABS: 650.0000 mg | ORAL_TABLET | Freq: Four times a day (QID) | ORAL | 0 refills | Status: AC | PRN

## 2017-09-28 MED ORDER — VITAMIN C 250 MG PO TABS: 250.0000 mg | ORAL_TABLET | Freq: Two times a day (BID) | ORAL | 3 refills | Status: AC

## 2017-09-28 MED ORDER — IBUPROFEN 600 MG PO TABS: 600.0000 mg | ORAL_TABLET | Freq: Four times a day (QID) | ORAL | 0 refills | Status: DC

## 2017-09-28 MED ORDER — WITCH HAZEL-GLYCERIN EX PADS: 1.0000 "application " | MEDICATED_PAD | CUTANEOUS | 12 refills | Status: DC | PRN

## 2017-09-28 MED ORDER — FERROUS GLUCONATE 324 (38 FE) MG PO TABS: 324.0000 mg | ORAL_TABLET | Freq: Two times a day (BID) | ORAL | 0 refills | Status: DC

## 2017-09-28 NOTE — Discharge Summary (Signed)
OB Discharge Summary     Patient Name: Katherine Riggs DOB: [birth year] MRN: [id number]  Date of admission: 09/26/2017 Delivering MD: Silverio LayIVARD, SANDRA   Date of discharge: 09/28/2017  Admitting diagnosis: Prior Cesarean Section x3 Intrauterine pregnancy: 2728w0d     Secondary diagnosis:  Active Problems:   Cesarean delivery delivered   Anemia, postpartum      Discharge diagnosis: Term Pregnancy Delivered                                                                                                Post partum procedures: none   Complications: None  Hospital course:  Sceduled C/S   32 y.o. yo [id number] at 8628w0d was admitted to the hospital 09/26/2017 for scheduled cesarean section with the following indication:Elective Repeat.  Membrane Rupture Time/Date: 10:20 AM ,09/26/2017   Patient delivered a Viable infant.09/26/2017  Details of operation can be found in separate operative note.  Pateint had an uncomplicated postpartum course.  She is ambulating, tolerating a regular diet, passing flatus, and urinating well. Patient is discharged home in stable condition on  09/28/17         Physical exam  Vitals:   09/27/17 1100 09/27/17 1439 09/27/17 2120 09/28/17 0500  BP: (!) 117/59 120/72 111/66 118/73  Pulse: 74 71 83 89  Resp: 18   18  Temp: 97.9 F (36.6 C) 98.3 F (36.8 C) 97.9 F (36.6 C) 98 F (36.7 C)  TempSrc: Oral Oral Oral Oral  SpO2: 97% 99%    Weight:      Height:       Labs: Lab Results  Component Value Date   WBC 10.4 09/27/2017   HGB 9.1 (L) 09/27/2017   HCT 27.2 (L) 09/27/2017   MCV 92.5 09/27/2017   PLT 226 09/27/2017   CMP Latest Ref Rng & Units 09/26/2017  Glucose 70 - 99 mg/dL -  BUN 6 - 23 mg/dL -  Creatinine 1.470.44 - 8.291.00 mg/dL 5.620.62  Sodium 130135 - 865145 mEq/L -  Potassium 3.5 - 5.1 mEq/L -  Chloride 96 - 112 mEq/L -  CO2 19 - 32 mEq/L -  Calcium 8.4 - 10.5 mg/dL -  Total Protein 6.0 - 8.3 g/dL -  Total Bilirubin 0.3 - 1.2 mg/dL -  Alkaline Phos 39 -  117 U/L -  AST 0 - 37 U/L -  ALT 0 - 35 U/L -    Discharge instruction: per After Visit Summary and "Baby and Me Booklet".  After visit meds:  Allergies as of 09/28/2017   No Known Allergies     Medication List    STOP taking these medications   ferrous sulfate 325 (65 FE) MG tablet     TAKE these medications   acetaminophen 325 MG tablet Commonly known as:  TYLENOL Take 2 tablets (650 mg total) by mouth every 6 (six) hours as needed for up to 14 days for mild pain or moderate pain. What changed:    how much to take  reasons to take this   ferrous gluconate 324 MG tablet Commonly known as:  [employer]GON  Take 1 tablet (324 mg total) by mouth 2 (two) times daily with a meal.   ibuprofen 600 MG tablet Commonly known as:  ADVIL,MOTRIN Take 1 tablet (600 mg total) by mouth every 6 (six) hours.   prenatal vitamin w/FE, FA 27-1 MG Tabs tablet Take 1 tablet by mouth daily.   vitamin C 250 MG tablet Commonly known as:  ASCORBIC ACID Take 1 tablet (250 mg total) by mouth 2 (two) times daily. With iron tablet   witch hazel-glycerin pad Commonly known as:  TUCKS Apply 1 application topically as needed for hemorrhoids.       Diet: routine diet  Activity: Advance as tolerated. Pelvic rest for 6 weeks.   Outpatient follow up:2 weeks Follow up Appt:No future appointments. Follow up Visit:No follow-ups on file.  Postpartum contraception: Not Discussed  Newborn Data: Live born female  Birth Weight: 6 lb 15.8 oz (3170 g) APGAR: 8, 9  Newborn Delivery   Birth date/time:  09/26/2017 10:20:00 Delivery type:  C-Section, Low Transverse Trial of labor:  No C-section categorization:  Repeat     Baby Feeding: Breast Disposition:home with mother   09/28/2017 Faylene Million [facility] Jeffords, CNM

## 2017-09-28 NOTE — Lactation Note (Signed)
This note was copied from a baby's chart. Lactation Consultation Note  Patient Name: Katherine Riggs [id number]'s Date: 09/28/2017 Reason for consult: Initial assessment;Term  P3 mother whose infant is now 3548 hours old.  Mother has breast feeding experience with her first two children.  Mother's breasts are soft and non tender and she feels like her milk is transitioning.  She had no questions/concerns regarding breastfeeding.  She is familiar with feeding cues and hand expression.  Manual pump with instructions for use provided and demonstrated.  Changed flange size from a #24 to #27 for a more appropriate fit.  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  She has our number for any questions after discharge.  Father present.   Maternal Data Formula Feeding for Exclusion: No Has patient been taught Hand Expression?: Yes Does the patient have breastfeeding experience prior to this delivery?: Yes  Feeding    LATCH Score                   Interventions    Lactation Tools Discussed/Used Pump Review: Setup, frequency, and cleaning;Milk Storage Initiated by:: Katherine Riggs Date initiated:: 09/28/17   Consult Status Consult Status: Complete    Katherine Riggs 09/28/2017, 11:09 AM

## 2017-09-28 NOTE — Progress Notes (Signed)
Subjective: Postpartum Day 2: Cesarean Delivery Patient reports tolerating PO, + flatus, + BM and no problems voiding.   Desires d/c today. C/o some nipple pain on L side with latch, goes away quickly, but some discomfort now. Latches football bilateral.   Objective: Vital signs in last 24 hours: Temp:  [97.9 F (36.6 C)-98.3 F (36.8 C)] 98 F (36.7 C) (09/15 0500) Pulse Rate:  [71-89] 89 (09/15 0500) Resp:  [18] 18 (09/15 0500) BP: (111-120)/(59-73) 118/73 (09/15 0500) SpO2:  [97 %-99 %] 99 % (09/14 1439)  Physical Exam:  General: alert, cooperative and no distress Lochia: appropriate Uterine Fundus: firm Incision: healing well, no significant drainage, no significant erythema DVT Evaluation: No evidence of DVT seen on physical exam.  Recent Labs    09/26/17 1331 09/27/17 0634  HGB 11.2* 9.1*  HCT 32.6* 27.2*    Assessment/Plan: Status post Cesarean section. Doing well postoperatively.  Discharge home with standard precautions and return to clinic in 4-6 weeks F/u 2 weeks in office for incision check Fe gluconate + vitC for anemia.  Recommend cross cradle on L side to help with latch and we talked about nipple care and latch.  Faylene MillionKathleen A Aniza Shor 09/28/2017, 8:35 AM

## 2017-09-29 LAB — RH IG WORKUP (INCLUDES ABO/RH)
ABO/RH(D): A NEG
Fetal Screen: NEGATIVE
Gestational Age(Wks): 39
Unit division: 0

## 2018-03-08 ENCOUNTER — Encounter (HOSPITAL_BASED_OUTPATIENT_CLINIC_OR_DEPARTMENT_OTHER): Payer: Self-pay | Admitting: Emergency Medicine

## 2018-03-08 ENCOUNTER — Emergency Department (HOSPITAL_BASED_OUTPATIENT_CLINIC_OR_DEPARTMENT_OTHER): Payer: Medicaid Other

## 2018-03-08 ENCOUNTER — Emergency Department (HOSPITAL_BASED_OUTPATIENT_CLINIC_OR_DEPARTMENT_OTHER)
Admission: EM | Admit: 2018-03-08 | Discharge: 2018-03-08 | Disposition: A | Discharge status: Home / Self Care | Payer: Medicaid Other | Attending: Emergency Medicine | Admitting: Emergency Medicine

## 2018-03-08 ENCOUNTER — Other Ambulatory Visit: Payer: Self-pay

## 2018-03-08 DIAGNOSIS — S92001A Unspecified fracture of right calcaneus, initial encounter for closed fracture: Secondary | ICD-10-CM | POA: Insufficient documentation

## 2018-03-08 DIAGNOSIS — W01198A Fall on same level from slipping, tripping and stumbling with subsequent striking against other object, initial encounter: Secondary | ICD-10-CM | POA: Insufficient documentation

## 2018-03-08 DIAGNOSIS — Y939 Activity, unspecified: Secondary | ICD-10-CM | POA: Insufficient documentation

## 2018-03-08 DIAGNOSIS — Y929 Unspecified place or not applicable: Secondary | ICD-10-CM | POA: Insufficient documentation

## 2018-03-08 DIAGNOSIS — Z79899 Other long term (current) drug therapy: Secondary | ICD-10-CM | POA: Insufficient documentation

## 2018-03-08 DIAGNOSIS — S93401A Sprain of unspecified ligament of right ankle, initial encounter: Secondary | ICD-10-CM | POA: Insufficient documentation

## 2018-03-08 DIAGNOSIS — Y999 Unspecified external cause status: Secondary | ICD-10-CM | POA: Insufficient documentation

## 2018-03-08 DIAGNOSIS — X501XXA Overexertion from prolonged static or awkward postures, initial encounter: Secondary | ICD-10-CM | POA: Insufficient documentation

## 2018-03-08 MED ORDER — ONDANSETRON 4 MG PO TBDP
4.0000 mg | ORAL_TABLET | Freq: Once | ORAL | Status: AC
  Administered 2018-03-08: 4 mg via ORAL
  Filled 2018-03-08: qty 1

## 2018-03-08 MED ORDER — HYDROCODONE-ACETAMINOPHEN 5-325 MG PO TABS: 1.0000 | ORAL_TABLET | Freq: Four times a day (QID) | ORAL | 0 refills | Status: DC | PRN

## 2018-03-08 MED ORDER — HYDROCODONE-ACETAMINOPHEN 5-325 MG PO TABS
2.0000 | ORAL_TABLET | Freq: Once | ORAL | Status: AC
  Administered 2018-03-08: 2 via ORAL
  Filled 2018-03-08: qty 2

## 2018-03-08 NOTE — ED Provider Notes (Signed)
[facility] EMERGENCY DEPARTMENT Provider Note   CSN: [id number] Arrival date & time: 03/08/18  1345    History   Chief Complaint Chief Complaint  Patient presents with  . Ankle Pain    HPI Katherine Riggs is a 33 y.o. female who presents for evaluation of right ankle pain that began approximately 2.5 hours prior to ED arrival after mechanical fall.  Patient reports that she was walking and she tripped over BP bassinet which caused her ankle to twist.  She does not remember if it was an inversion or eversion injury.  She reports that then she she tripped over another basket causing her to fall.  Patient reports that since then, she has not been able to walk or bear weight.  She has not taken any medications for pain.  She states that the ankle has been swollen.  She reports that pain is worse when she moves the foot.  She denies any numbness/weakness.     The history is provided by the patient.    Past Medical History:  Diagnosis Date  . Abortion, elective or therapeutic 2011 and 2012  . Complication of anesthesia    high BP in PACU- resolved in PACU  . Cystitis 2012  . Hypertension, postpartum condition or complication 2008  . No pertinent past medical history   . Pyelonephritis 2012  . Status post repeat low transverse cesarean section 05/20/2012    Patient Active Problem List   Diagnosis Date Noted  . Anemia, postpartum 09/28/2017  . Cesarean delivery delivered 09/26/2017  . Status post repeat low transverse cesarean section 05/20/2012  . TAB X 2 11/21/2011  . Hx pyelonephritis 11/21/2011  . Rh negative, maternal 01/15/2011  . History of cesarean delivery x 2 01/15/2011    Past Surgical History:  Procedure Laterality Date  . CESAREAN SECTION  12/2006   baby's head position  . CESAREAN SECTION  01/15/2011   Procedure: CESAREAN SECTION;  Surgeon: Michael Litter, MD;  Location: [facility];  Service: Gynecology;  Laterality: N/A;  . CESAREAN SECTION N/A 05/20/2012     Procedure: CESAREAN SECTION REPEAT ;  Surgeon: Michael Litter, MD;  Location: [facility];  Service: Obstetrics;  Laterality: N/A;  Repeat  . CESAREAN SECTION N/A 09/26/2017   Procedure: REPEAT CESAREAN SECTION;  Surgeon: Silverio Lay, MD;  Location: [facility] BIRTHING SUITES;  Service: Obstetrics;  Laterality: N/A;     OB History    Gravida  6   Para  4   Term  4   Preterm      AB  2   Living  4     SAB      TAB  2   Ectopic      Multiple  0   Live Births  4            Home Medications    Prior to Admission medications   Medication Sig Start Date End Date Taking? Authorizing Provider  ferrous gluconate (FERGON) 324 MG tablet Take 1 tablet (324 mg total) by mouth 2 (two) times daily with a meal. 09/28/17 10/28/17  Page, Faylene Million, CNM  HYDROcodone-acetaminophen (NORCO/VICODIN) 5-325 MG tablet Take 1-2 tablets by mouth every 6 (six) hours as needed. 03/08/18   Maxwell Caul, PA-C  ibuprofen (ADVIL,MOTRIN) 600 MG tablet Take 1 tablet (600 mg total) by mouth every 6 (six) hours. 09/28/17   Page, Faylene Million, CNM  prenatal vitamin w/FE, FA (PRENATAL 1 + 1) 27-1 MG  TABS Take 1 tablet by mouth daily.      [provider]  witch hazel-glycerin (TUCKS) pad Apply 1 application topically as needed for hemorrhoids. 09/28/17   Page, Faylene Million, CNM    Family History Family History  Problem Relation Age of Onset  . Migraines Mother   . Asthma Brother   . Diabetes Paternal Aunt   . Cancer Maternal Grandmother        breast and esophageal  . Heart attack Paternal Grandmother   . Hypertension Paternal Grandmother   . Diabetes Paternal Grandmother   . Hypertension Paternal Grandfather   . ADD / ADHD Son   . ODD Son     Social History Social History   Tobacco Use  . Smoking status: Never Smoker  . Smokeless tobacco: Never Used  Substance Use Topics  . Alcohol use: No  . Drug use: No     Allergies   Patient has no known allergies.   Review of  Systems Review of Systems  Musculoskeletal:       Right ankle pain  Neurological: Negative for weakness and numbness.  All other systems reviewed and are negative.    Physical Exam Updated Vital Signs BP 120/77 (BP Location: Right Arm)   Pulse 84   Temp 98 F (36.7 C) (Oral)   Resp 18   Ht 5\' 1"  (1.549 m)   Wt 86.2 kg   LMP 02/22/2018   SpO2 100%   BMI 35.90 kg/m   Physical Exam Vitals signs and nursing note reviewed.  Constitutional:      Appearance: She is well-developed.  HENT:     Head: Normocephalic and atraumatic.  Eyes:     General: No scleral icterus.       Right eye: No discharge.        Left eye: No discharge.     Conjunctiva/sclera: Conjunctivae normal.  Neck:     Musculoskeletal: Normal range of motion.  Cardiovascular:     Pulses:          Dorsalis pedis pulses are 2+ on the right side and 2+ on the left side.  Pulmonary:     Effort: Pulmonary effort is normal.  Musculoskeletal:     Comments: Tenderness to palpation to the lateral malleolus of the right ankle that extends over to the dorsal aspect of the right foot. Small amount of overlying soft tissue swelling.  No overlying ecchymosis, warmth, or erythema. No deformity or crepitus noted.  Dorsiflexion and plantar flexion intact but with subjective reports of pain.  No deficit with noted palpation of Achilles tendon.  No tenderness palpation noted to proximal tib-fib.  No tenderness palpation of the left lower extremity.  Skin:    General: Skin is warm and dry.     Capillary Refill: Capillary refill takes less than 2 seconds.     Comments: The skin is intact to ankle/foot.  The foot is warm and well perfused with intact sensation  Neurological:     Mental Status: She is alert.     Comments: Sensation intact throughout all major nerve distributions of the feet   Psychiatric:        Speech: Speech normal.        Behavior: Behavior normal.      ED Treatments / Results  Labs (all labs ordered are  listed, but only abnormal results are displayed) Labs Reviewed - No data to display  EKG None  Radiology Dg Ankle Complete Right  Result Date:  03/08/2018 CLINICAL DATA:  Right ankle pain due to an injury suffered a trip and fall over a bassinet today. Initial encounter. EXAM: RIGHT ANKLE - COMPLETE 3+ VIEW COMPARISON:  None. FINDINGS: Small bone fragment is seen along the lateral side of the foot, likely off the calcaneus most consistent with an avulsion fracture. Imaged bones otherwise appear normal. There is some soft tissue swelling about the lateral ankle. IMPRESSION: Findings compatible with a chip fracture which is likely off the lateral calcaneus and may be due to avulsion at the extensor digitorum brevis muscle origin. Electronically Signed   By: Drusilla Kanner M.D.   On: 03/08/2018 14:35    Procedures Procedures (including critical care time)  Medications Ordered in ED Medications  ondansetron (ZOFRAN-ODT) disintegrating tablet 4 mg (4 mg Oral Given 03/08/18 1414)  HYDROcodone-acetaminophen (NORCO/VICODIN) 5-325 MG per tablet 2 tablet (2 tablets Oral Given 03/08/18 1432)     Initial Impression / Assessment and Plan / ED Course  I have reviewed the triage vital signs and the nursing notes.  Pertinent labs & imaging results that were available during my care of the patient were reviewed by me and considered in my medical decision making (see chart for details).        33 y.o. F who presents for evaluation of right ankle pain after a mechanical fall that occurred just prior to ED arrival.  Patient reports that she tripped over a baby bassinet.  Has not been able to ambulate or bear weight since the incident. Patient is afebrile, non-toxic appearing, sitting comfortably on examination table. Vital signs reviewed and stable.  Patient is neurovascularly intact.  Certain for sprain versus fracture versus dislocation.  X-rays ordered at triage.  X-ray reviewed.  There is a small  chip fracture likely off the lateral calcaneus that may be due to avulsion at the extensor digitorum brevis muscle origin.  Discussed results with patient.  We will put her in a CAM walker boot. Plan to provide outpatient orthopedic referral for further follow-up and evaluation At this time, patient exhibits no emergent life-threatening condition that require further evaluation in ED or admission. Patient had ample opportunity for questions and discussion. All patient's questions were answered with full understanding. Strict return precautions discussed. Patient expresses understanding and agreement to plan.   Portions of this note were generated with Scientist, clinical (histocompatibility and immunogenetics). Dictation errors may occur despite best attempts at proofreading.   Final Clinical Impressions(s) / ED Diagnoses   Final diagnoses:  Sprain of right ankle, unspecified ligament, initial encounter  Avulsion fracture of calcaneus, right, closed, initial encounter    ED Discharge Orders         Ordered    HYDROcodone-acetaminophen (NORCO/VICODIN) 5-325 MG tablet  Every 6 hours PRN     03/08/18 1504           Rosana Hoes 03/09/18 0009    Benjiman Core, MD 03/09/18 [id number]

## 2018-03-08 NOTE — Discharge Instructions (Signed)
You can take Tylenol or Ibuprofen as directed for pain. You can alternate Tylenol and Ibuprofen every 4 hours. If you take Tylenol at 1pm, then you can take Ibuprofen at 5pm. Then you can take Tylenol again at 9pm.   Take pain medications as directed for break through pain. Do not drive or operate machinery while taking this medication.   Follow-up with referred orthopedic doctor for further evaluation.  Return the emergency department for any worsening pain, redness or swelling of the foot, fever or any other worsening or concerning symptoms.

## 2018-03-08 NOTE — ED Notes (Signed)
This department does not have a cam walker that would correctly fit pt. Pt has crutches at home to use.

## 2018-03-08 NOTE — ED Notes (Signed)
Pt pushed out in wheelchair by husband.

## 2018-03-08 NOTE — ED Triage Notes (Signed)
Patient states that she tripped over a baby bassinet and hurt her right ankle earlier today

## 2018-03-12 ENCOUNTER — Encounter: Payer: Self-pay | Admitting: Family Medicine

## 2018-03-12 ENCOUNTER — Ambulatory Visit (INDEPENDENT_AMBULATORY_CARE_PROVIDER_SITE_OTHER): Payer: Self-pay | Admitting: Family Medicine

## 2018-03-12 VITALS — BP 135/77 | Ht 61.0 in | Wt 205.0 lb

## 2018-03-12 DIAGNOSIS — S99911A Unspecified injury of right ankle, initial encounter: Secondary | ICD-10-CM

## 2018-03-12 NOTE — Progress Notes (Signed)
PCP: Patient, No Pcp Per  Subjective:   HPI: Patient is a 33 y.o. female here for right ankle injury.  Patient reports on Sunday she accidentally tripped over a baby bassinet, inverted her right ankle and landed on this. She could not bear weight after this. Swelling and bruising. Has been icing, elevating, taking ibuprofen and norco as needed. Wore a splint for a couple days then switched to boot which she's wearing now. Pain is sharp, worse with ambulation. Pain is lateral. No prior injuries. No numbness.  Past Medical History:  Diagnosis Date  . Abortion, elective or therapeutic 2011 and 2012  . Complication of anesthesia    high BP in PACU- resolved in PACU  . Cystitis 2012  . Hypertension, postpartum condition or complication 2008  . No pertinent past medical history   . Pyelonephritis 2012  . Status post repeat low transverse cesarean section 05/20/2012    Current Outpatient Medications on File Prior to Visit  Medication Sig Dispense Refill  . ferrous gluconate (FERGON) 324 MG tablet Take 1 tablet (324 mg total) by mouth 2 (two) times daily with a meal. 60 tablet 0  . HYDROcodone-acetaminophen (NORCO/VICODIN) 5-325 MG tablet Take 1-2 tablets by mouth every 6 (six) hours as needed. (Patient not taking: Reported on 03/12/2018) 6 tablet 0  . ibuprofen (ADVIL,MOTRIN) 600 MG tablet Take 1 tablet (600 mg total) by mouth every 6 (six) hours. (Patient not taking: Reported on 03/12/2018) 30 tablet 0  . JUNEL FE 1/20 1-20 MG-MCG tablet Take 1 tablet by mouth daily.    . prenatal vitamin w/FE, FA (PRENATAL 1 + 1) 27-1 MG TABS Take 1 tablet by mouth daily.      Marland Kitchen witch hazel-glycerin (TUCKS) pad Apply 1 application topically as needed for hemorrhoids. (Patient not taking: Reported on 03/12/2018) 40 each 12   No current facility-administered medications on file prior to visit.     Past Surgical History:  Procedure Laterality Date  . CESAREAN SECTION  12/2006   baby's head position  .  CESAREAN SECTION  01/15/2011   Procedure: CESAREAN SECTION;  Surgeon: Michael Litter, MD;  Location: [facility];  Service: Gynecology;  Laterality: N/A;  . CESAREAN SECTION N/A 05/20/2012   Procedure: CESAREAN SECTION REPEAT ;  Surgeon: Michael Litter, MD;  Location: [facility];  Service: Obstetrics;  Laterality: N/A;  Repeat  . CESAREAN SECTION N/A 09/26/2017   Procedure: REPEAT CESAREAN SECTION;  Surgeon: Silverio Lay, MD;  Location: [facility] BIRTHING SUITES;  Service: Obstetrics;  Laterality: N/A;    No Known Allergies  Social History   Socioeconomic History  . Marital status: Married    Spouse name: Not on file  . Number of children: Not on file  . Years of education: Not on file  . Highest education level: Not on file  Occupational History  . Not on file  Social Needs  . Financial resource strain: Not hard at all  . Food insecurity:    Worry: Never true    Inability: Never true  . Transportation needs:    Medical: No    Non-medical: Not on file  Tobacco Use  . Smoking status: Never Smoker  . Smokeless tobacco: Never Used  Substance and Sexual Activity  . Alcohol use: No  . Drug use: No  . Sexual activity: Not Currently    Birth control/protection: None    Comment: pregnant  Lifestyle  . Physical activity:    Days per week: 0 days  Minutes per session: 0 min  . Stress: Not on file  Relationships  . Social connections:    Talks on phone: Patient refused    Gets together: Patient refused    Attends religious service: Patient refused    Active member of club or organization: Patient refused    Attends meetings of clubs or organizations: Patient refused    Relationship status: Patient refused  . Intimate partner violence:    Fear of current or ex partner: No    Emotionally abused: No    Physically abused: No    Forced sexual activity: No  Other Topics Concern  . Not on file  Social History Narrative  . Not on file    Family History  Problem Relation Age of Onset  .  Migraines Mother   . Asthma Brother   . Diabetes Paternal Aunt   . Cancer Maternal Grandmother        breast and esophageal  . Heart attack Paternal Grandmother   . Hypertension Paternal Grandmother   . Diabetes Paternal Grandmother   . Hypertension Paternal Grandfather   . ADD / ADHD Son   . ODD Son     BP 135/77   Ht 5\' 1"  (1.549 m)   Wt 205 lb (93 kg)   LMP 02/22/2018   BMI 38.73 kg/m   Review of Systems: See HPI above.     Objective:  Physical Exam:  Gen: NAD, comfortable in exam room  Right ankle: Mod swelling about ankle, worse laterally.  Associated bruising lateral and medial into heel.  No other deformity. Mod limitation ROM all directions but 5/5 strength. TTP over ATFL, lateral anterior calcaneus, less distal post tib into navicular.  No other tenderness. Trace ant drawer and talar tilt.   Negative syndesmotic compression. Thompsons test negative. NV intact distally.  Left ankle: No deformity. FROM with 5/5 strength. No tenderness to palpation. NVI distally.   Assessment & Plan:  1. Right ankle injury - independently reviewed radiographs noting very small avulsion off distal lateral calcaneus.  Also clinically with lateral ankle sprain.  Continue with cam walker.  Icing, aleve or ibuprofen.  Elevation.  Crutches if needed.  Motion exercises.  F/u in 2 weeks for reevaluation.  Expect 6 weeks to recover.

## 2018-03-12 NOTE — Patient Instructions (Signed)
You have an ankle sprain and small avulsion off your calcaneus. Ice the area for 15 minutes at a time, 3-4 times a day Aleve 2 tabs twice a day with food OR ibuprofen 3 tabs three times a day with food for pain and inflammation. Elevate above the level of your heart when possible Crutches if needed to help with walking Bear weight when tolerated Use boot when up and walking around to help with stability while you recover from this injury. Come out of the boot twice a day to do Up/down and alphabet exercises 2-3 sets of each. Consider physical therapy for strengthening and balance exercises. If not improving as expected, we may repeat x-rays or consider further testing like an MRI. Follow up in 2 weeks.

## 2018-03-26 ENCOUNTER — Ambulatory Visit: Payer: Medicaid Other | Admitting: Family Medicine

## 2018-10-15 ENCOUNTER — Other Ambulatory Visit: Payer: Self-pay

## 2018-10-15 DIAGNOSIS — Z20822 Contact with and (suspected) exposure to covid-19: Secondary | ICD-10-CM

## 2018-10-16 LAB — NOVEL CORONAVIRUS, NAA: SARS-CoV-2, NAA: NOT DETECTED

## 2019-08-31 IMAGING — DX DG ANKLE COMPLETE 3+V*R*
3 series · 3 of 3 positions shown · non-contrast
Comparison: None.

CLINICAL DATA: Right ankle pain due to an injury suffered a trip
and fall over a bassinet today. Initial encounter.

EXAM:
RIGHT ANKLE - COMPLETE 3+ VIEW

[ankle ap]
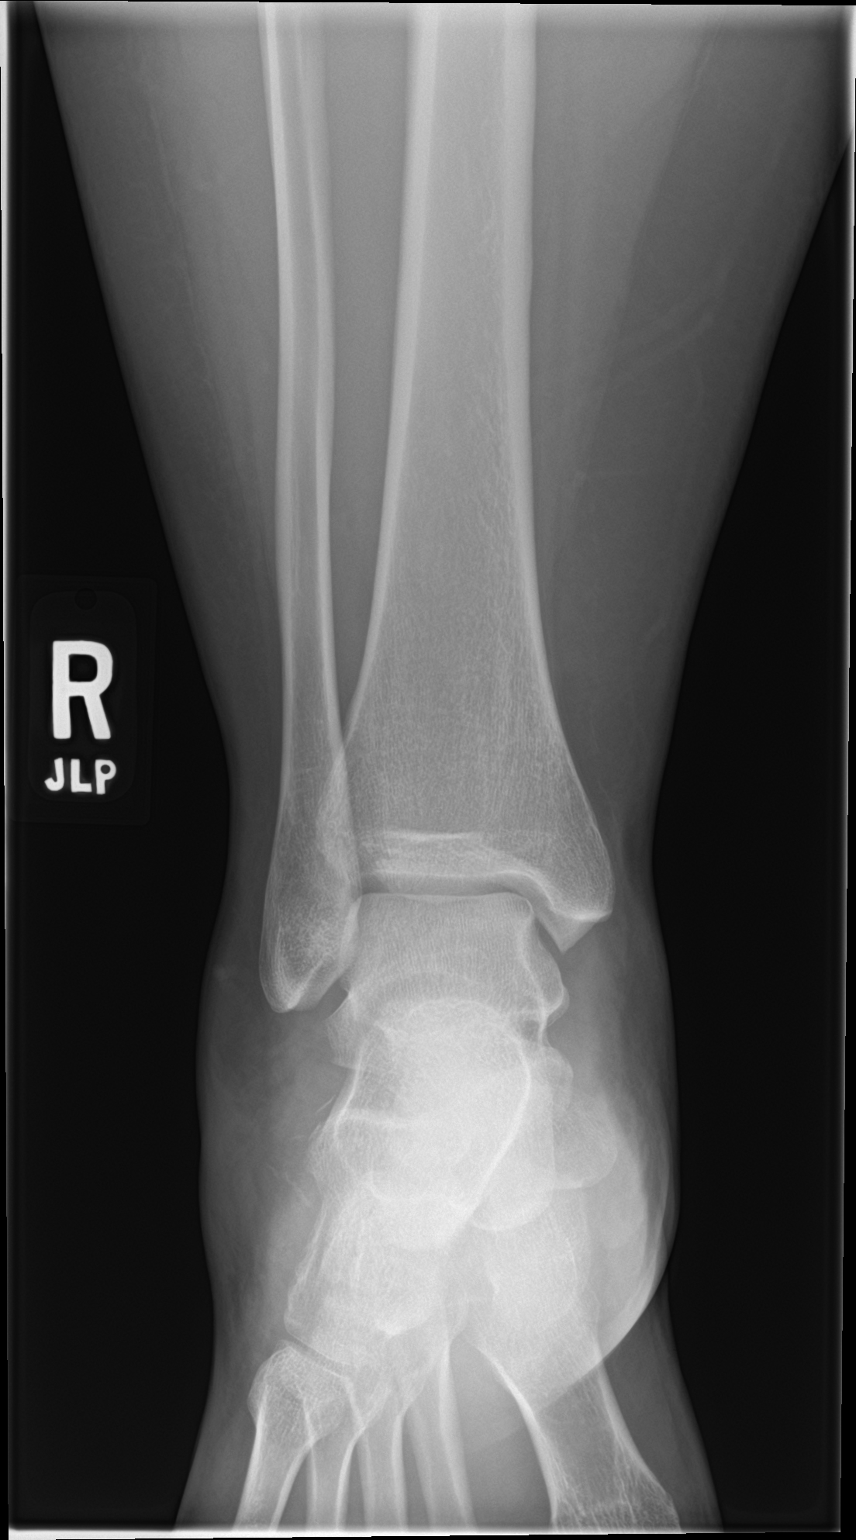

[ankle obl]
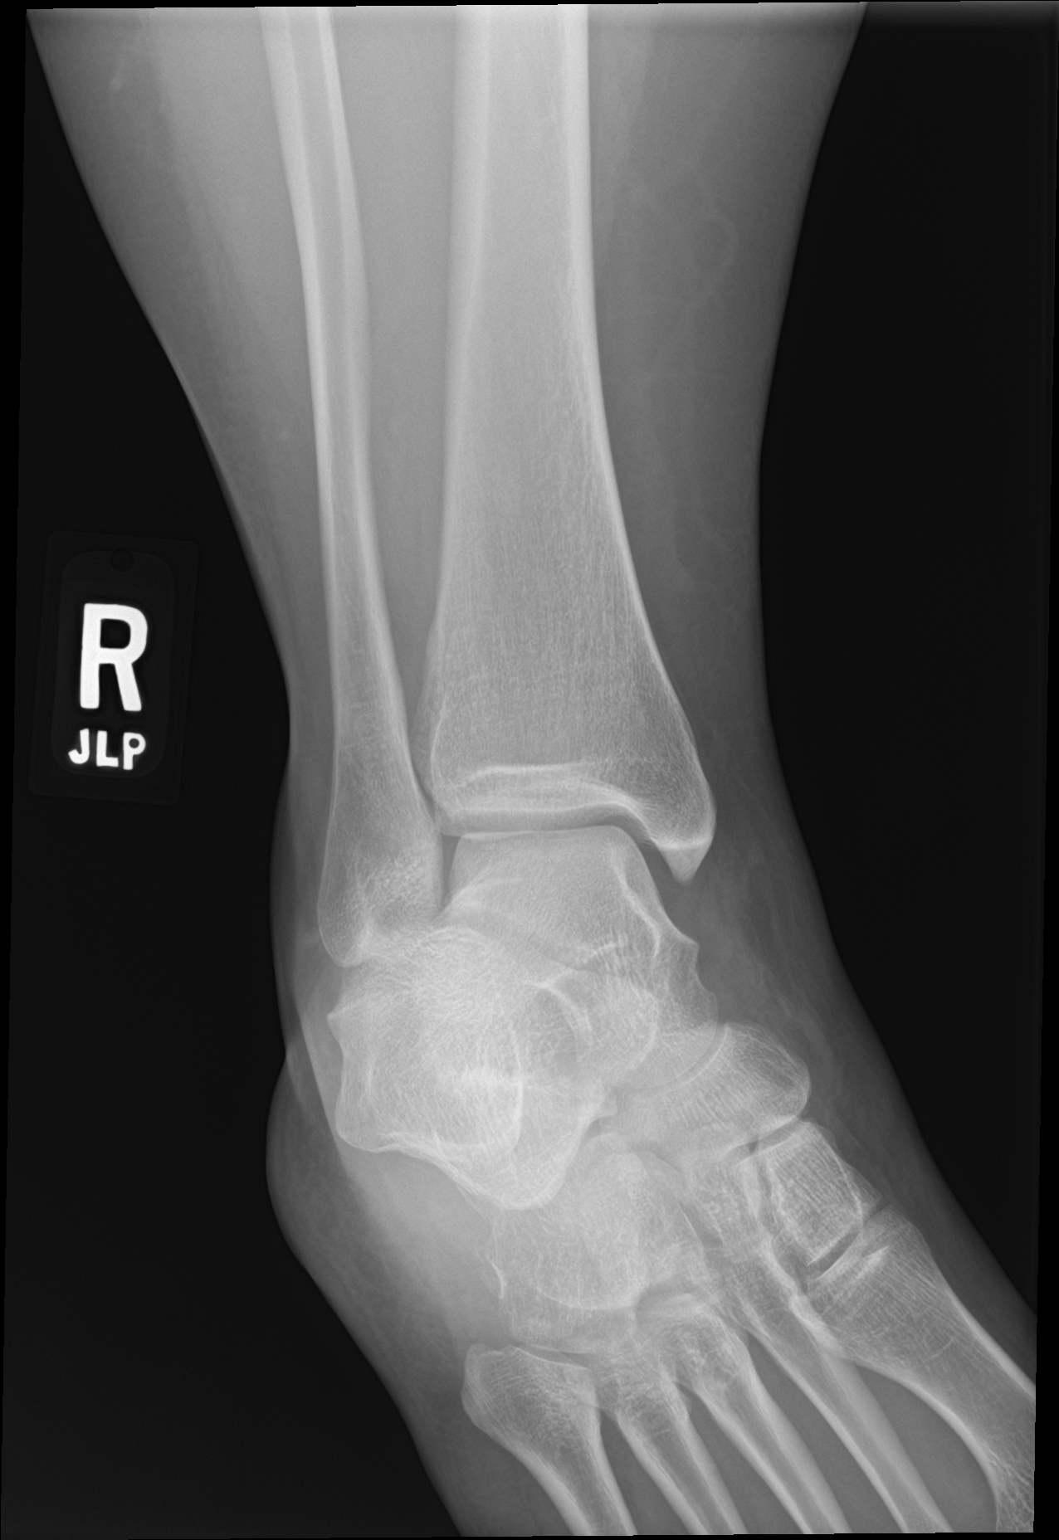

[ankle lat]
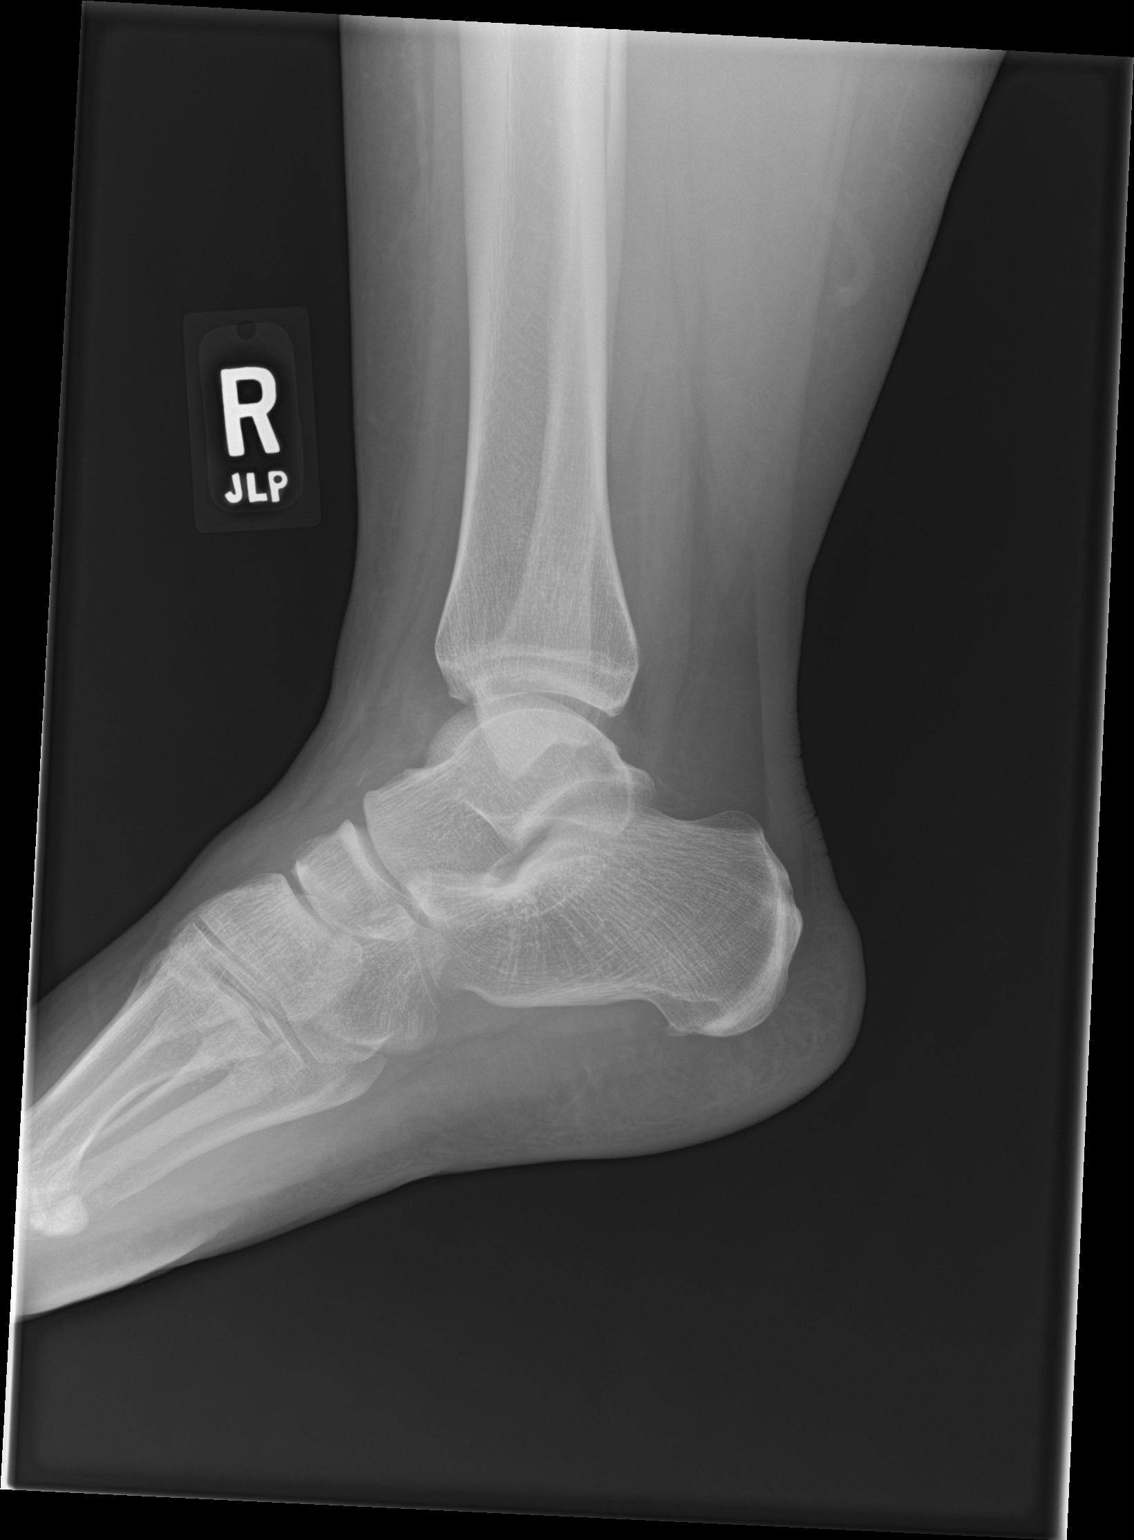

[3 of 3 positions shown; findings below may reference images not displayed]

FINDINGS: Small bone fragment is seen along the lateral side of the foot,
likely off the calcaneus most consistent with an avulsion fracture.
Imaged bones otherwise appear normal. There is some soft tissue
swelling about the lateral ankle.
IMPRESSION: Findings compatible with a chip fracture which is likely off the
lateral calcaneus and may be due to avulsion at the extensor
digitorum brevis muscle origin.

## 2022-06-22 ENCOUNTER — Other Ambulatory Visit: Payer: Self-pay

## 2022-06-22 ENCOUNTER — Encounter (HOSPITAL_COMMUNITY)
Admission: AD | Disposition: A | Discharge status: Home / Self Care | Payer: Self-pay | Source: Home / Self Care | Attending: Obstetrics and Gynecology

## 2022-06-22 ENCOUNTER — Observation Stay (HOSPITAL_COMMUNITY): Payer: BC Managed Care – PPO | Admitting: Anesthesiology

## 2022-06-22 ENCOUNTER — Inpatient Hospital Stay (HOSPITAL_BASED_OUTPATIENT_CLINIC_OR_DEPARTMENT_OTHER)
Admission: AD | Admit: 2022-06-22 | Discharge: 2022-06-24 | DRG: 770 | Disposition: A | Discharge status: Home / Self Care | Payer: BC Managed Care – PPO | Attending: Obstetrics and Gynecology | Admitting: Obstetrics and Gynecology

## 2022-06-22 ENCOUNTER — Encounter (HOSPITAL_BASED_OUTPATIENT_CLINIC_OR_DEPARTMENT_OTHER): Payer: Self-pay

## 2022-06-22 ENCOUNTER — Emergency Department (HOSPITAL_BASED_OUTPATIENT_CLINIC_OR_DEPARTMENT_OTHER): Payer: BC Managed Care – PPO

## 2022-06-22 ENCOUNTER — Ambulatory Visit
Admission: EM | Admit: 2022-06-22 | Discharge: 2022-06-22 | Disposition: A | Discharge status: Home / Self Care | Payer: BC Managed Care – PPO

## 2022-06-22 DIAGNOSIS — O039 Complete or unspecified spontaneous abortion without complication: Secondary | ICD-10-CM | POA: Diagnosis not present

## 2022-06-22 DIAGNOSIS — D62 Acute posthemorrhagic anemia: Secondary | ICD-10-CM | POA: Diagnosis not present

## 2022-06-22 DIAGNOSIS — A419 Sepsis, unspecified organism: Secondary | ICD-10-CM | POA: Diagnosis present

## 2022-06-22 DIAGNOSIS — Z3A01 Less than 8 weeks gestation of pregnancy: Secondary | ICD-10-CM | POA: Diagnosis not present

## 2022-06-22 DIAGNOSIS — Z6841 Body Mass Index (BMI) 40.0 and over, adult: Secondary | ICD-10-CM | POA: Diagnosis not present

## 2022-06-22 DIAGNOSIS — E6609 Other obesity due to excess calories: Secondary | ICD-10-CM | POA: Diagnosis present

## 2022-06-22 DIAGNOSIS — O0737 Sepsis following failed attempted termination of pregnancy: Secondary | ICD-10-CM | POA: Diagnosis present

## 2022-06-22 DIAGNOSIS — R102 Pelvic and perineal pain: Secondary | ICD-10-CM | POA: Diagnosis not present

## 2022-06-22 DIAGNOSIS — Z3A10 10 weeks gestation of pregnancy: Secondary | ICD-10-CM

## 2022-06-22 DIAGNOSIS — O034 Incomplete spontaneous abortion without complication: Secondary | ICD-10-CM | POA: Diagnosis present

## 2022-06-22 DIAGNOSIS — N939 Abnormal uterine and vaginal bleeding, unspecified: Secondary | ICD-10-CM | POA: Diagnosis not present

## 2022-06-22 HISTORY — PX: DILATION AND CURETTAGE OF UTERUS: SHX78

## 2022-06-22 LAB — COMPREHENSIVE METABOLIC PANEL
ALT: 15 U/L (ref 0–44)
ALT: 16 U/L (ref 0–44)
AST: 22 U/L (ref 15–41)
AST: 20 U/L (ref 15–41)
Albumin: 3.2 g/dL — ABNORMAL LOW (ref 3.5–5.0)
Albumin: 2.9 g/dL — ABNORMAL LOW (ref 3.5–5.0)
Alkaline Phosphatase: 51 U/L (ref 38–126)
Alkaline Phosphatase: 52 U/L (ref 38–126)
Anion gap: 9 (ref 5–15)
Anion gap: 13 (ref 5–15)
BUN: 9 mg/dL (ref 6–20)
BUN: 7 mg/dL (ref 6–20)
CO2: 21 mmol/L — ABNORMAL LOW (ref 22–32)
CO2: 18 mmol/L — ABNORMAL LOW (ref 22–32)
Calcium: 8.2 mg/dL — ABNORMAL LOW (ref 8.9–10.3)
Calcium: 8.5 mg/dL — ABNORMAL LOW (ref 8.9–10.3)
Chloride: 104 mmol/L (ref 98–111)
Chloride: 106 mmol/L (ref 98–111)
Creatinine, Ser: 0.66 mg/dL (ref 0.44–1.00)
Creatinine, Ser: 0.8 mg/dL (ref 0.44–1.00)
GFR, Estimated: >60 mL/min (ref >60)
GFR, Estimated: >60 mL/min (ref >60)
Glucose, Bld: 96 mg/dL (ref 70–99)
Glucose, Bld: 94 mg/dL (ref 70–99)
Potassium: 3.2 mmol/L — ABNORMAL LOW (ref 3.5–5.1)
Potassium: 3.1 mmol/L — ABNORMAL LOW (ref 3.5–5.1)
Sodium: 134 mmol/L — ABNORMAL LOW (ref 135–145)
Sodium: 137 mmol/L (ref 135–145)
Total Bilirubin: 0.7 mg/dL (ref 0.3–1.2)
Total Bilirubin: 0.8 mg/dL (ref 0.3–1.2)
Total Protein: 6.5 g/dL (ref 6.5–8.1)
Total Protein: 5.9 g/dL — ABNORMAL LOW (ref 6.5–8.1)

## 2022-06-22 LAB — WET PREP, GENITAL
Clue Cells Wet Prep HPF POC: NONE SEEN
Sperm: NONE SEEN
Trich, Wet Prep: NONE SEEN
WBC, Wet Prep HPF POC: >=10 — AB (ref <10)
Yeast Wet Prep HPF POC: NONE SEEN

## 2022-06-22 LAB — DIC (DISSEMINATED INTRAVASCULAR COAGULATION)PANEL
D-Dimer, Quant: 3.7 ug/mL-FEU — ABNORMAL HIGH (ref 0.00–0.50)
Fibrinogen: 735 mg/dL — ABNORMAL HIGH (ref 210–475)
INR: 1.2 (ref 0.8–1.2)
Platelets: 188 10*3/uL (ref 150–400)
Prothrombin Time: 15.2 seconds (ref 11.4–15.2)
Smear Review: NONE SEEN
aPTT: 34 seconds (ref 24–36)

## 2022-06-22 LAB — BPAM RBC
Blood Product Expiration Date: 202407012359
ISSUE DATE / TIME: 202406082118

## 2022-06-22 LAB — CBC
HCT: 20.8 % — ABNORMAL LOW (ref 36.0–46.0)
HCT: 23.7 % — ABNORMAL LOW (ref 36.0–46.0)
Hemoglobin: 7.1 g/dL — ABNORMAL LOW (ref 12.0–15.0)
Hemoglobin: 7.5 g/dL — ABNORMAL LOW (ref 12.0–15.0)
MCH: 31.7 pg (ref 26.0–34.0)
MCH: 30 pg (ref 26.0–34.0)
MCHC: 34.1 g/dL (ref 30.0–36.0)
MCHC: 31.6 g/dL (ref 30.0–36.0)
MCV: 92.9 fL (ref 80.0–100.0)
MCV: 94.8 fL (ref 80.0–100.0)
Platelets: 185 10*3/uL (ref 150–400)
Platelets: 192 10*3/uL (ref 150–400)
RBC: 2.24 MIL/uL — ABNORMAL LOW (ref 3.87–5.11)
RBC: 2.5 MIL/uL — ABNORMAL LOW (ref 3.87–5.11)
RDW: 12.7 % (ref 11.5–15.5)
RDW: 12.6 % (ref 11.5–15.5)
WBC: 10.1 10*3/uL (ref 4.0–10.5)
WBC: 9.7 10*3/uL (ref 4.0–10.5)
nRBC: 0 % (ref 0.0–0.2)
nRBC: 0 % (ref 0.0–0.2)

## 2022-06-22 LAB — TYPE AND SCREEN: Unit division: 0

## 2022-06-22 LAB — MAGNESIUM: Magnesium: 1.8 mg/dL (ref 1.7–2.4)

## 2022-06-22 LAB — LACTIC ACID, PLASMA: Lactic Acid, Venous: 0.9 mmol/L (ref 0.5–1.9)

## 2022-06-22 LAB — PREPARE RBC (CROSSMATCH)

## 2022-06-22 LAB — HCG, QUANTITATIVE, PREGNANCY: hCG, Beta Chain, Quant, S: 26423 m[IU]/mL — ABNORMAL HIGH (ref <5)

## 2022-06-22 SURGERY — DILATION AND CURETTAGE
Anesthesia: General

## 2022-06-22 MED ORDER — ACETAMINOPHEN 325 MG PO TABS: 325.0000 mg | ORAL_TABLET | ORAL | Status: DC | PRN

## 2022-06-22 MED ORDER — PROMETHAZINE HCL 25 MG/ML IJ SOLN: 6.2500 mg | INTRAMUSCULAR | Status: DC | PRN

## 2022-06-22 MED ORDER — METHYLERGONOVINE MALEATE 0.2 MG/ML IJ SOLN
INTRAMUSCULAR | Status: DC | PRN
  Administered 2022-06-22: .2 mg via INTRAMUSCULAR

## 2022-06-22 MED ORDER — SCOPOLAMINE 1 MG/3DAYS TD PT72
MEDICATED_PATCH | TRANSDERMAL | Status: DC | PRN
  Administered 2022-06-22: 1 via TRANSDERMAL

## 2022-06-22 MED ORDER — SODIUM CHLORIDE 0.9 % IV SOLN
2.0000 g | Freq: Four times a day (QID) | INTRAVENOUS | Status: DC
  Administered 2022-06-23 – 2022-06-24 (×6): 2 g via INTRAVENOUS
  Filled 2022-06-22 (×6): qty 2000

## 2022-06-22 MED ORDER — MIDAZOLAM HCL 5 MG/5ML IJ SOLN
INTRAMUSCULAR | Status: DC | PRN
  Administered 2022-06-22: 2 mg via INTRAVENOUS

## 2022-06-22 MED ORDER — CLINDAMYCIN PHOSPHATE 900 MG/50ML IV SOLN
900.0000 mg | Freq: Three times a day (TID) | INTRAVENOUS | Status: AC
  Administered 2022-06-22 – 2022-06-24 (×5): 900 mg via INTRAVENOUS
  Filled 2022-06-22 (×6): qty 50

## 2022-06-22 MED ORDER — PROPOFOL 10 MG/ML IV BOLUS
INTRAVENOUS | Status: AC
  Filled 2022-06-22: qty 20

## 2022-06-22 MED ORDER — DEXAMETHASONE SODIUM PHOSPHATE 4 MG/ML IJ SOLN
INTRAMUSCULAR | Status: DC | PRN
  Administered 2022-06-22: 10 mg via INTRAVENOUS

## 2022-06-22 MED ORDER — SUCCINYLCHOLINE CHLORIDE 200 MG/10ML IV SOSY
PREFILLED_SYRINGE | INTRAVENOUS | Status: DC | PRN
  Administered 2022-06-22: 100 mg via INTRAVENOUS

## 2022-06-22 MED ORDER — IBUPROFEN 600 MG PO TABS
600.0000 mg | ORAL_TABLET | Freq: Four times a day (QID) | ORAL | Status: DC
  Administered 2022-06-23 – 2022-06-24 (×6): 600 mg via ORAL
  Filled 2022-06-22 (×6): qty 1

## 2022-06-22 MED ORDER — MIDAZOLAM HCL 2 MG/2ML IJ SOLN
INTRAMUSCULAR | Status: AC
  Filled 2022-06-22: qty 2

## 2022-06-22 MED ORDER — AMISULPRIDE (ANTIEMETIC) 5 MG/2ML IV SOLN: 10.0000 mg | Freq: Once | INTRAVENOUS | Status: DC | PRN

## 2022-06-22 MED ORDER — SODIUM CHLORIDE 0.9 % IV BOLUS
1000.0000 mL | Freq: Once | INTRAVENOUS | Status: AC
  Administered 2022-06-22: 1000 mL via INTRAVENOUS

## 2022-06-22 MED ORDER — DOXYCYCLINE HYCLATE 100 MG IV SOLR
200.0000 mg | INTRAVENOUS | Status: DC
  Filled 2022-06-22: qty 200

## 2022-06-22 MED ORDER — TRANEXAMIC ACID-NACL 1000-0.7 MG/100ML-% IV SOLN
INTRAVENOUS | Status: DC | PRN
  Administered 2022-06-22: 1000 mg via INTRAVENOUS

## 2022-06-22 MED ORDER — ROCURONIUM BROMIDE 100 MG/10ML IV SOLN
INTRAVENOUS | Status: DC | PRN
  Administered 2022-06-22: 30 mg via INTRAVENOUS

## 2022-06-22 MED ORDER — ONDANSETRON HCL 4 MG/2ML IJ SOLN: 4.0000 mg | Freq: Four times a day (QID) | INTRAMUSCULAR | Status: DC | PRN

## 2022-06-22 MED ORDER — SODIUM CHLORIDE 0.9% IV SOLUTION: Freq: Once | INTRAVENOUS | Status: DC

## 2022-06-22 MED ORDER — LACTATED RINGERS IV SOLN: INTRAVENOUS | Status: DC | PRN

## 2022-06-22 MED ORDER — SIMETHICONE 80 MG PO CHEW: 80.0000 mg | CHEWABLE_TABLET | Freq: Four times a day (QID) | ORAL | Status: DC | PRN

## 2022-06-22 MED ORDER — OXYCODONE HCL 5 MG PO TABS: 5.0000 mg | ORAL_TABLET | ORAL | Status: DC | PRN

## 2022-06-22 MED ORDER — FENTANYL CITRATE (PF) 100 MCG/2ML IJ SOLN
25.0000 ug | INTRAMUSCULAR | Status: DC | PRN
  Administered 2022-06-22: 50 ug via INTRAVENOUS

## 2022-06-22 MED ORDER — ONDANSETRON HCL 4 MG/2ML IJ SOLN
INTRAMUSCULAR | Status: DC | PRN
  Administered 2022-06-22: 4 mg via INTRAVENOUS

## 2022-06-22 MED ORDER — METHYLERGONOVINE MALEATE 0.2 MG PO TABS
0.2000 mg | ORAL_TABLET | Freq: Three times a day (TID) | ORAL | Status: AC
  Administered 2022-06-23 (×3): 0.2 mg via ORAL
  Filled 2022-06-22 (×3): qty 1

## 2022-06-22 MED ORDER — TRANEXAMIC ACID-NACL 1000-0.7 MG/100ML-% IV SOLN
INTRAVENOUS | Status: AC
  Filled 2022-06-22: qty 100

## 2022-06-22 MED ORDER — PROPOFOL 10 MG/ML IV BOLUS
INTRAVENOUS | Status: DC | PRN
  Administered 2022-06-22: 140 mg via INTRAVENOUS

## 2022-06-22 MED ORDER — PHENYLEPHRINE HCL (PRESSORS) 10 MG/ML IV SOLN
INTRAVENOUS | Status: DC | PRN
  Administered 2022-06-22: 80 ug via INTRAVENOUS

## 2022-06-22 MED ORDER — MAGNESIUM HYDROXIDE 400 MG/5ML PO SUSP: 30.0000 mL | Freq: Every day | ORAL | Status: DC | PRN

## 2022-06-22 MED ORDER — LIDOCAINE HCL (CARDIAC) PF 100 MG/5ML IV SOSY
PREFILLED_SYRINGE | INTRAVENOUS | Status: DC | PRN
  Administered 2022-06-22: 40 mg via INTRATRACHEAL

## 2022-06-22 MED ORDER — LACTATED RINGERS IV SOLN: INTRAVENOUS | Status: DC

## 2022-06-22 MED ORDER — FENTANYL CITRATE (PF) 250 MCG/5ML IJ SOLN
INTRAMUSCULAR | Status: DC | PRN
  Administered 2022-06-22: 100 ug via INTRAVENOUS

## 2022-06-22 MED ORDER — ACETAMINOPHEN 10 MG/ML IV SOLN
1000.0000 mg | Freq: Once | INTRAVENOUS | Status: DC | PRN
  Administered 2022-06-22: 1000 mg via INTRAVENOUS

## 2022-06-22 MED ORDER — KETOROLAC TROMETHAMINE 15 MG/ML IJ SOLN
15.0000 mg | Freq: Once | INTRAMUSCULAR | Status: AC
  Administered 2022-06-22: 15 mg via INTRAVENOUS
  Filled 2022-06-22: qty 1

## 2022-06-22 MED ORDER — SUGAMMADEX SODIUM 200 MG/2ML IV SOLN
INTRAVENOUS | Status: DC | PRN
  Administered 2022-06-22: 200 mg via INTRAVENOUS

## 2022-06-22 MED ORDER — ONDANSETRON HCL 4 MG PO TABS: 4.0000 mg | ORAL_TABLET | Freq: Four times a day (QID) | ORAL | Status: DC | PRN

## 2022-06-22 MED ORDER — ACETAMINOPHEN 325 MG PO TABS: 650.0000 mg | ORAL_TABLET | ORAL | Status: DC | PRN

## 2022-06-22 MED ORDER — OXYCODONE HCL 5 MG/5ML PO SOLN: 5.0000 mg | Freq: Once | ORAL | Status: DC | PRN

## 2022-06-22 MED ORDER — FENTANYL CITRATE (PF) 250 MCG/5ML IJ SOLN
INTRAMUSCULAR | Status: AC
  Filled 2022-06-22: qty 5

## 2022-06-22 MED ORDER — ACETAMINOPHEN 10 MG/ML IV SOLN
INTRAVENOUS | Status: AC
  Filled 2022-06-22: qty 100

## 2022-06-22 MED ORDER — ACETAMINOPHEN 160 MG/5ML PO SOLN: 325.0000 mg | ORAL | Status: DC | PRN

## 2022-06-22 MED ORDER — OXYCODONE HCL 5 MG PO TABS: 5.0000 mg | ORAL_TABLET | Freq: Once | ORAL | Status: DC | PRN

## 2022-06-22 MED ORDER — SODIUM CHLORIDE 0.9 % IV SOLN: INTRAVENOUS | Status: DC

## 2022-06-22 MED ORDER — ALBUMIN HUMAN 5 % IV SOLN: INTRAVENOUS | Status: DC | PRN

## 2022-06-22 MED ORDER — FENTANYL CITRATE (PF) 100 MCG/2ML IJ SOLN
INTRAMUSCULAR | Status: AC
  Filled 2022-06-22: qty 2

## 2022-06-22 MED ORDER — GENTAMICIN SULFATE 40 MG/ML IJ SOLN
5.0000 mg/kg | INTRAVENOUS | Status: AC
  Administered 2022-06-22 – 2022-06-23 (×2): 330 mg via INTRAVENOUS
  Filled 2022-06-22 (×2): qty 8.25

## 2022-06-22 SURGICAL SUPPLY — 17 items
CANISTER SUCT 3000ML PPV (MISCELLANEOUS) ×1 IMPLANT
CATH ROBINSON RED A/P 16FR (CATHETERS) ×1 IMPLANT
CNTNR URN SCR LID CUP LEK RST (MISCELLANEOUS) ×1 IMPLANT
CONN ST 3/8 X 1/2 (MISCELLANEOUS) IMPLANT
CONT SPEC 4OZ STRL OR WHT (MISCELLANEOUS) ×1
GAUZE 4X4 16PLY ~~LOC~~+RFID DBL (SPONGE) IMPLANT
GLOVE SURG ORTHO 8.0 STRL STRW (GLOVE) ×1 IMPLANT
GOWN STRL REUS W/ TWL LRG LVL3 (GOWN DISPOSABLE) ×1 IMPLANT
GOWN STRL REUS W/ TWL XL LVL3 (GOWN DISPOSABLE) ×1 IMPLANT
GOWN STRL REUS W/TWL LRG LVL3 (GOWN DISPOSABLE) ×1
GOWN STRL REUS W/TWL XL LVL3 (GOWN DISPOSABLE) ×1
KIT TURNOVER KIT B (KITS) ×1 IMPLANT
PACK VAGINAL MINOR WOMEN LF (CUSTOM PROCEDURE TRAY) ×1 IMPLANT
PAD OB MATERNITY 4.3X12.25 (PERSONAL CARE ITEMS) ×1 IMPLANT
TOWEL GREEN STERILE FF (TOWEL DISPOSABLE) ×2 IMPLANT
UNDERPAD 30X36 HEAVY ABSORB (UNDERPADS AND DIAPERS) ×1 IMPLANT
VACURETTE 8MM F TIP (MISCELLANEOUS) IMPLANT

## 2022-06-22 NOTE — Progress Notes (Signed)
Pt being followed by [facility] for Sepsis protocol. 

## 2022-06-22 NOTE — Consult Note (Signed)
ANTIBIOTIC CONSULT NOTE - INITIAL  Pharmacy Consult for Gentamicin Indication: Sepsis  No Known Allergies  Patient Measurements: Height: 5\' 1"  (154.9 cm) Weight: 93 kg (205 lb) IBW/kg (Calculated) : 47.8 Adjusted Body Weight: 65.9 kg   Vital Signs: Temp: 99.2 F (37.3 C) (06/08 1930) Temp Source: Oral (06/08 1850) BP: 123/64 (06/08 1951) Pulse Rate: 112 (06/08 1951)  Labs: Recent Labs    06/22/22 1438 06/22/22 2016 06/22/22 2019  WBC 10.1 9.7  --   HGB 7.1* 7.5*  --   PLT 185 192 188  CREATININE 0.66  --   --    No results for input(s): "GENTTROUGH", "GENTPEAK", "GENTRANDOM" in the last 72 hours.   Microbiology: Recent Results (from the past 720 hour(s))  Wet prep, genital     Status: Abnormal   Collection Time: 06/22/22  3:50 PM   Specimen: PATH Cytology Cervicovaginal Ancillary Only  Result Value Ref Range Status   Yeast Wet Prep HPF POC NONE SEEN NONE SEEN Final   Trich, Wet Prep NONE SEEN NONE SEEN Final   Clue Cells Wet Prep HPF POC NONE SEEN NONE SEEN Final   WBC, Wet Prep HPF POC >=10 (A) <10 Final   Sperm NONE SEEN  Final    Comment: Performed at [facility], [address] [place], [place] [address]    Medications:  Ampicillin 2 g every 6 hours (6/8>>  Gentamicin 5 mg/kg every 24 hours (6/8>>  Clindamycin 900 mg every 8 hours (6/8>>   Assessment: 37 y.o. female [id number] at [redacted]w[redacted]d here for incomplete abortion and concern for septic abortion with initial temp 102 and tachycardia on arrival to MAU .   Goal of Therapy:  Gentamicin peak 6-8 mg/L and Trough < 1 mg/L  Plan:  Gentamicin 5 mg/kg (300 mg) IV every 24 hrs  Check Scr with next labs if gentamicin continued. Will check gentamicin levels if continued > 72hr or clinically indicated.  Janey Greaser 06/22/2022,9:05 PM

## 2022-06-22 NOTE — ED Provider Notes (Signed)
Katherine Riggs CARE    CSN: [id number] Arrival date & time: 06/22/22  1228      History   Chief Complaint Chief Complaint  Patient presents with   Abdominal Pain    W/ cramping    HPI Katherine Riggs is a 37 y.o. female.   37yo female presents with c/o pelvic pain. Took an abortion pill on 5/30 and again on 5/31 at 7 weeks of pregnancy. States she was fine through the weekend but come Monday she started developing some pelvic pain and cramping. Monday and Tuesday pt reports extremely heavy bleeding with clots. Wednesday and Thursday she felt a bit better, but yesterday pain and cramping in her abdomen returned. She states the severe heavy bleeding also returned. 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 She took a shower and felt dizzy and is uncertain if she passed out. Pt has had two abortions in the past and states she has never had this type of side effect or reaction. Pt states her pelvic pain is unbearable, took ibuprofen this morning. No fever or vomiting.   Abdominal Pain   Past Medical History:  Diagnosis Date   Abortion, elective or therapeutic 2011 and 2012   Complication of anesthesia    high BP in PACU- resolved in PACU   Cystitis 2012   Hypertension, postpartum condition or complication 2008   No pertinent past medical history    Pyelonephritis 2012   Status post repeat low transverse cesarean section 05/20/2012    Patient Active Problem List   Diagnosis Date Noted   Anemia, postpartum 09/28/2017   Cesarean delivery delivered 09/26/2017   Status post repeat low transverse cesarean section 05/20/2012   TAB X 2 11/21/2011   Hx pyelonephritis 11/21/2011   Rh negative, maternal 01/15/2011   History of cesarean delivery x 2 01/15/2011    Past Surgical History:  Procedure Laterality Date   CESAREAN SECTION  12/2006   baby's head position   CESAREAN SECTION  01/15/2011   Procedure: CESAREAN SECTION;   Surgeon: Michael Litter, MD;  Location: [facility];  Service: Gynecology;  Laterality: N/A;   CESAREAN SECTION N/A 05/20/2012   Procedure: CESAREAN SECTION REPEAT ;  Surgeon: Michael Litter, MD;  Location: [facility];  Service: Obstetrics;  Laterality: N/A;  Repeat   CESAREAN SECTION N/A 09/26/2017   Procedure: REPEAT CESAREAN SECTION;  Surgeon: Silverio Lay, MD;  Location: [facility];  Service: Obstetrics;  Laterality: N/A;    OB History     Gravida  6   Para  4   Term  4   Preterm      AB  2   Living  4      SAB      IAB  2   Ectopic      Multiple  0   Live Births  4            Home Medications    Prior to Admission medications   Medication Sig Start Date End Date Taking? Authorizing Provider  FLUoxetine (PROZAC) 20 MG capsule Take 20 mg by mouth. 02/15/21  Yes [provider]  HYDROcodone-acetaminophen (NORCO/VICODIN) 5-325 MG tablet Take 1-2 tablets by mouth every 6 (six) hours as needed. Patient not taking: Reported on 03/12/2018 03/08/18   Maxwell Caul PA-C    Family History Family History  Problem Relation Age of Onset   Migraines Mother  Asthma Brother    Diabetes Paternal Aunt    Cancer Maternal Grandmother        breast and esophageal   Heart attack Paternal Grandmother    Hypertension Paternal Grandmother    Diabetes Paternal Grandmother    Hypertension Paternal Grandfather    ADD / ADHD Son    ODD Son     Social History Social History   Tobacco Use   Smoking status: Never   Smokeless tobacco: Never  Vaping Use   Vaping Use: Never used  Substance Use Topics   Alcohol use: No   Drug use: No     Allergies   Patient has no known allergies.   Review of Systems Review of Systems  Gastrointestinal:  Positive for abdominal pain.  As per HPI   Physical Exam Triage Vital Signs ED Triage Vitals  Enc Vitals Group     BP 06/22/22 1241 123/76     Pulse Rate 06/22/22 1241 (!) 111     Resp 06/22/22 1241 18      Temp 06/22/22 1241 98.4 F (36.9 C)     Temp Source 06/22/22 1241 Oral     SpO2 06/22/22 1241 100 %     Weight --      Height --      Head Circumference --      Peak Flow --      Pain Score 06/22/22 1242 9     Pain Loc --      Pain Edu? --      Excl. in GC? --    No data found.  Updated Vital Signs BP 123/76 (BP Location: Right Arm)   Pulse (!) 111   Temp 98.4 F (36.9 C) (Oral)   Resp 18   LMP 04/21/2022 (Approximate)   SpO2 100%   Visual Acuity Right Eye Distance:   Left Eye Distance:   Bilateral Distance:    Right Eye Near:   Left Eye Near:    Bilateral Near:     Physical Exam Vitals and nursing note reviewed.  Constitutional:      General: She is in acute distress.     Appearance: She is well-developed. She is not ill-appearing, toxic-appearing or diaphoretic.     Comments: Moderate pain appreciated  Neurological:     General: No focal deficit present.     Mental Status: She is alert and oriented to person, place, and time.  Psychiatric:        Mood and Affect: Mood is anxious.     Comments: Pt crying in room      UC Treatments / Results  Labs (all labs ordered are listed, but only abnormal results are displayed) Labs Reviewed - No data to display  EKG   Radiology No results found.  Procedures Procedures (including critical care time)  Medications Ordered in UC Medications - No data to display  Initial Impression / Assessment and Plan / UC Course  I have reviewed the triage vital signs and the nursing notes.  Pertinent labs & imaging results that were available during my care of the patient were reviewed by me and considered in my medical decision making (see chart for details).     Severe pelvic pain, clotting and bleeding s/p medical abortion >1 week ago - pt having dizziness and lightheadedness ontop of her current sx. She called the clinic that gave her the medications who instructed her to obtain an [place]. Unfortunately, urgent care does  not have access to  this imaging modality today and thus workup needs to be transferred to ER. Additionally I suspect pt may need labs and we do not have onsite lab. Pt understands and agrees to workup at ER.   Final Clinical Impressions(s) / UC Diagnoses   Final diagnoses:  Pelvic pain  Abortion  Vaginal bleeding     Discharge Instructions      Please head to the ER for a further workup and evaluation. Please obtain a ride and do not drive yourself.      ED Prescriptions   None    PDMP not reviewed this encounter.   Maretta Bees, [place] 06/22/22 1320

## 2022-06-22 NOTE — MAU Note (Signed)
.  Katherine Riggs is a 37 y.o. at Unknown here in MAU reporting: Sent over from [employer] [facility] for further evaluation. Patient had a medical termination on 5/30. She reports her bleeding had ceased this past Wednesday and returned yesterday as well as severe pelvic and abdominal pain. Upon arrival, patients temperature noted to be 102.5 with a pulse rate of 126. Cleone Slim, CNM, at bedside. Patient's vaginal bleeding evaluated. Dr. Earlene Plater at bedside at 1900.  LMP: 04/21/2022 Lab orders placed from triage:  none

## 2022-06-22 NOTE — Discharge Instructions (Addendum)
Please head to the ER for a further workup and evaluation. Please obtain a ride and do not drive yourself.

## 2022-06-22 NOTE — ED Triage Notes (Signed)
Took abortion pill on 5/30, had heavy bleeding and cramping on Sunday, since Tuesday has had worsening abdominal pain and continued heavy bleeding with clots.   Sent here by [facility]

## 2022-06-22 NOTE — ED Notes (Signed)
Patient is being discharged from the Urgent Care and sent to the Emergency Department via POV. Per Guy Sandifer PA, patient is in need of higher level of care due to further eval and possible need for D&C. Patient is aware and verbalizes understanding of plan of care.  Vitals:   06/22/22 1241  BP: 123/76  Pulse: (!) 111  Resp: 18  Temp: 98.4 F (36.9 C)  SpO2: 100%

## 2022-06-22 NOTE — Anesthesia Procedure Notes (Signed)
Procedure Name: Intubation Date/Time: 06/22/2022 9:20 PM  Performed by: Claudina Lick, CRNAPre-anesthesia Checklist: Patient identified, Emergency Drugs available, Suction available and Patient being monitored Patient Re-evaluated:Patient Re-evaluated prior to induction Oxygen Delivery Method: Circle system utilized Preoxygenation: Pre-oxygenation with 100% oxygen Induction Type: IV induction Laryngoscope Size: Miller and 2 Grade View: Grade I Tube type: Oral Tube size: 7.0 mm Number of attempts: 1 Airway Equipment and Method: Stylet Placement Confirmation: ETT inserted through vocal cords under direct vision, positive ETCO2 and breath sounds checked- equal and bilateral Secured at: 22 cm Tube secured with: Tape Dental Injury: Teeth and Oropharynx as per pre-operative assessment

## 2022-06-22 NOTE — Op Note (Signed)
Frances Furbish PROCEDURE DATE: 06/22/2022  PREOPERATIVE DIAGNOSIS: incomplete miscarriage POSTOPERATIVE DIAGNOSIS: The same PROCEDURE:     Dilation and Evacuation SURGEON:  Mariel Aloe, MD  INDICATIONS: 37 y.o. [phone] with incomplete miscarriage s/p medical termination, needing surgical completion.  Risks of surgery were discussed with the patient including but not limited to: bleeding which may require transfusion; infection which may require antibiotics; injury to uterus or surrounding organs; need for additional procedures including laparotomy or laparoscopy; possibility of intrauterine scarring which may impair future fertility; and other postoperative/anesthesia complications. Written informed consent was obtained.    FINDINGS:  A 10 week size uterus, large amounts of products of conception, specimen sent to pathology.  ANESTHESIA: General. INTRAVENOUS FLUIDS:  500 ml of LR ESTIMATED BLOOD LOSS:  250 ml. UOP: 100 ml Albumin: 250 ml SPECIMENS:  Products of conception sent to pathology COMPLICATIONS:  None immediate.  PROCEDURE DETAILS:  The patient received intravenous Doxycycline while in the preoperative area.  She was then taken to the operating room where general anesthesia was administered and was found to be adequate.  After an adequate timeout was performed, she was placed in the dorsal lithotomy position and examined; then prepped and draped in the sterile manner.   Her bladder was catheterized for an unmeasured amount of clear, yellow urine. A vaginal speculum was then placed in the patient's vagina and a single tooth tenaculum was applied to the anterior lip of the cervix. The uterus was anteverted and sounded to 10 cm.  The cervix was gently dilated to accommodate a 8 mm suction curette that was gently advanced to the uterine fundus. The suction device was then activated and curette slowly rotated to clear the uterus of products of conception. POC were noted in the suction tubing.   As the curette was retracted , a large clot/mass was noted in the cervical os.  The tissue forceps were used to grasp the mass and a significant amount of POC was removed in one mass.   Suction curettage was done until complete emptying of the uterus was confirmed. Bleeding increased after the removal of the mass and the patient  received 02. Mg of IM methergine. Bleeding resolved with the medication and bimanual massage.  Once there was minimal bleeding noted, the tenaculum was removed with good hemostasis noted.   All instruments were removed from the patient's vagina.  Sponge and instrument counts were correct times two  The patient tolerated the procedure well and was taken to the recovery area extubated, awake, and in stable condition.  The patient will be admitted for observation on OB specialty care to receive 2 units PRBC and continued antibiotics with ampicillin, gentamicin and clindamycin  Mariel Aloe, MD, FACOG Obstetrician & Gynecologist, [facility] for [facility], [facility]

## 2022-06-22 NOTE — ED Notes (Signed)
Last times he ate was 06/21/2022 20:00 Water at 06/22/2022 at 15:00

## 2022-06-22 NOTE — Anesthesia Preprocedure Evaluation (Addendum)
Anesthesia Evaluation  Patient identified by MRN, date of birth, ID band Patient awake    Reviewed: Allergy & Precautions, NPO status , Patient's Chart, lab work & pertinent test results  Airway Mallampati: I  TM Distance: >3 FB Neck ROM: Full    Dental  (+) Dental Advisory Given, Teeth Intact   Pulmonary neg pulmonary ROS   breath sounds clear to auscultation       Cardiovascular hypertension,  Rhythm:Regular Rate:Normal     Neuro/Psych negative neurological ROS  negative psych ROS   GI/Hepatic negative GI ROS, Neg liver ROS,,,  Endo/Other  negative endocrine ROS    Renal/GU Renal disease     Musculoskeletal negative musculoskeletal ROS (+)    Abdominal   Peds  Hematology negative hematology ROS (+)   Anesthesia Other Findings   Reproductive/Obstetrics                             Anesthesia Physical Anesthesia Plan  ASA: 2 and emergent  Anesthesia Plan: General   Post-op Pain Management: Tylenol PO (pre-op)* and Toradol IV (intra-op)*   Induction: Intravenous, Rapid sequence and Cricoid pressure planned  PONV Risk Score and Plan: 4 or greater and Ondansetron, Dexamethasone, Midazolam and Scopolamine patch - Pre-op  Airway Management Planned: Oral ETT  Additional Equipment: None  Intra-op Plan:   Post-operative Plan: Extubation in OR  Informed Consent: I have reviewed the patients History and Physical, chart, labs and discussed the procedure including the risks, benefits and alternatives for the proposed anesthesia with the patient or authorized representative who has indicated his/her understanding and acceptance.     Dental advisory given  Plan Discussed with: CRNA  Anesthesia Plan Comments:        Anesthesia Quick Evaluation

## 2022-06-22 NOTE — ED Provider Notes (Signed)
[place] [facility] Provider Note   CSN: [id number] Arrival date & time: 06/22/22  1336     History  Chief Complaint  Patient presents with   Abdominal Pain    Katherine Riggs is a 37 y.o. female.  Patient with history of cesarean section presents to the emergency department for evaluation of vaginal bleeding and pelvic pain.  Patient [facility]  At that time she was given oral AB medicine that day and doses for the following day.  Initially she had some cramping and bleeding which improved over the following weekend however at the beginning of this week (about 6/3) she developed heavier bleeding, passage of large blood clots and pelvic pain.  This has been waxing waning over the course of the week.  She states that she had a near syncopal episode while in the shower this morning.  [facility] and [facility] care who referred her to the emergency room.  Patient denies vomiting or diarrhea.  Currently bleeding is slowed.  She is followed by [facility].       Home Medications Prior to Admission medications   Medication Sig Start Date End Date Taking? Authorizing Provider  FLUoxetine (PROZAC) 20 MG capsule Take 20 mg by mouth. 02/15/21   [provider]  HYDROcodone-acetaminophen (NORCO/VICODIN) 5-325 MG tablet Take 1-2 tablets by mouth every 6 (six) hours as needed. Patient not taking: Reported on 03/12/2018 03/08/18   Maxwell Caul, PA-C      Allergies    Patient has no known allergies.    Review of Systems   Review of Systems  Physical Exam Updated Vital Signs BP (!) 118/58 (BP Location: Right Arm)   Pulse (!) 114   Temp 98.4 F (36.9 C) (Oral)   Resp 16   Ht 5\' 1"  (1.549 m)   Wt 93 kg   LMP 04/21/2022 (Approximate)   SpO2 100%   BMI 38.73 kg/m   Physical Exam Vitals and nursing note reviewed. Exam conducted  with a chaperone present.  Constitutional:      General: She is not in acute distress.    Appearance: She is well-developed.  HENT:     Head: Normocephalic and atraumatic.     Right Ear: External ear normal.     Left Ear: External ear normal.     Nose: Nose normal.  Eyes:     Conjunctiva/sclera: Conjunctivae normal.  Cardiovascular:     Rate and Rhythm: Normal rate and regular rhythm.     Heart sounds: No murmur heard. Pulmonary:     Effort: No respiratory distress.     Breath sounds: No wheezing, rhonchi or rales.  Abdominal:     Palpations: Abdomen is soft.     Tenderness: There is generalized abdominal tenderness. There is no guarding or rebound.     Comments: Mild to moderate generalized tenderness  Genitourinary:    Exam position: Lithotomy position.     Vagina: Bleeding present.     Cervix: Cervical bleeding present.     Uterus: Tender.      Comments: Dark red blood noted in vaginal vault with small clots.  Bleeding from cervix is mild to moderate. Musculoskeletal:     Cervical back: Normal range of motion and neck supple.     Right lower leg: No edema.     Left lower leg: No edema.  Skin:    General: Skin is warm and dry.     Findings: No rash.  Neurological:     General: No focal deficit present.     Mental Status: She is alert. Mental status is at baseline.     Motor: No weakness.  Psychiatric:        Mood and Affect: Mood normal.     ED Results / Procedures / Treatments   Labs (all labs ordered are listed, but only abnormal results are displayed) Labs Reviewed  CBC - Abnormal; Notable for the following components:      Result Value   RBC 2.24 (*)    Hemoglobin 7.1 (*)    HCT 20.8 (*)    All other components within normal limits  COMPREHENSIVE METABOLIC PANEL - Abnormal; Notable for the following components:   Sodium 134 (*)    Potassium 3.2 (*)    CO2 21 (*)    Calcium 8.2 (*)    Albumin 3.2 (*)    All other components within normal limits  HCG,  QUANTITATIVE, PREGNANCY - Abnormal; Notable for the following components:   hCG, Beta Chain, Quant, S 26,423 (*)    All other components within normal limits  WET PREP, GENITAL  GC/CHLAMYDIA PROBE AMP ([place]) NOT AT [facility]    EKG None  Radiology US OB LESS THAN 14 WEEKS WITH OB TRANSVAGINAL  Result Date: 06/22/2022 CLINICAL DATA:  Status post abortion pill 9 days ago with heavy bleeding, initial encounter EXAM: OBSTETRIC <14 WK [place] AND TRANSVAGINAL OB US TECHNIQUE: Both transabdominal and transvaginal ultrasound examinations were performed for complete evaluation of the gestation as well as the maternal uterus, adnexal regions, and pelvic cul-de-sac. Transvaginal technique was performed to assess early pregnancy. COMPARISON:  None Available. FINDINGS: Intrauterine gestational sac: Absent Maternal uterus/adnexae: Left adnexal cyst is noted measuring 1.9 cm. Prominent endometrium measuring 2.7 cm is noted. This is consistent with the recent pregnancy. Heterogeneity and increased vascularity is noted which may represent some retained products of conception. IMPRESSION: Heterogeneous and prominent endometrium with increased vascularity. These changes likely represent some retained products of conception. No gestational sac is seen. Electronically Signed   By: Alcide Clever M.D.   On: 06/22/2022 15:33    Procedures Procedures    Medications Ordered in ED Medications  ketorolac (TORADOL) 15 MG/ML injection 15 mg (15 mg Intravenous Given 06/22/22 1525)  sodium chloride 0.9 % bolus 1,000 mL (1,000 mLs Intravenous New Bag/Given 06/22/22 1527)    ED Course/ Medical Decision Making/ A&P    Patient seen and examined. History obtained directly from patient.  I reviewed urgent care note.  Labs/EKG: Ordered CBC, BMP, quantitative hCG.  Imaging: Ordered transvaginal ultrasound.  Medications/Fluids: Ordered: IV Toradol, IV fluid bolus.   Most recent vital signs reviewed and are as follows: BP (!)  118/58 (BP Location: Right Arm)   Pulse (!) 114   Temp 98.4 F (36.9 C) (Oral)   Resp 16   Ht 5\' 1"  (1.549 m)   Wt 93 kg   LMP 04/21/2022 (Approximate)   SpO2 100%   BMI 38.73 kg/m   Initial impression: Vaginal bleeding status post elective AB in first trimester pregnancy.  4:16 PM Reassessment performed. Patient appears stable.  Pelvic exam performed with NT chaperone.  Labs personally reviewed and interpreted including: CBC with white blood cell count 10.1, hemoglobin 7.1; CMP with mild hypokalemia at 3.2, otherwise unremarkable; beta-hCG 26,423.  Imaging results reviewed including: Pelvic ultrasound with findings concerning  for retained products of conception  Reviewed pertinent lab work and imaging with patient at bedside. Questions answered.   Most current vital signs reviewed and are as follows: BP 113/65   Pulse (!) 108   Temp 99 F (37.2 C) (Oral)   Resp 16   Ht 5\' 1"  (1.549 m)   Wt 93 kg   LMP 04/21/2022 (Approximate)   SpO2 95%   BMI 38.73 kg/m   Plan: I discussed case with Dr. Earlene Plater of OB/GYN.  She requested patient be transferred to the MAU and outpatient remain NPO.  Confirmed with patient that she has not eaten today.                             Medical Decision Making Amount and/or Complexity of Data Reviewed Labs: ordered. Radiology: ordered.  Risk Prescription drug management.   Patient with anemia due to blood loss from vaginal bleeding, suspected retained products of conception after medical abortion 9 days ago.  She will be transferred for OB/GYN evaluation and treatment.         Final Clinical Impression(s) / ED Diagnoses Final diagnoses:  Retained products of conception following abortion  ABLA (acute blood loss anemia)    Rx / DC Orders ED Discharge Orders     None         Renne Crigler, PA-C 06/22/22 1620    Terrilee Files, MD 06/22/22 [phone]

## 2022-06-22 NOTE — ED Triage Notes (Addendum)
Pt c/o abd pain since Tues. Pt was [redacted] weeks pregnant and states on 5/30 she went to a clinic and was given abortion pill. Started having cramping & clotting. Also been having dizziness and heavy bleeding. Says she fell in the shower while letting hot water run down her back. Ibuprofen prn. Last dose 6am this morning.

## 2022-06-22 NOTE — H&P (Signed)
OB/GYN Pre-Op History and Physical  Katherine Riggs is a 37 y.o. [id number] presenting for severe pelvic pain and heavy bleeding s/p medical termination 9 days ago. Patient reports taking mifepristone/misoprostol, had bleeding for several days and passed tissue, then had heavier bleeding and cramping 4 days later. Continued to have heavy bleeding and cramping that would worsen, then improve, then worsen. Then, this morning, she "passed out" in the shower and presented to [facility] where she was found to have retained products of conception and transferred here.  She reports having significant lower abdominal pain, slightly improved today from yesterday but still quite painful, and ongoing heavy bleeding that increases when she goes to the bathroom. H/o 4 x CS, h/o 1 x D&C. Otherwise healthy.      Past Medical History:  Diagnosis Date   Abortion, elective or therapeutic 2011 and 2012   Complication of anesthesia    high BP in PACU- resolved in PACU   Cystitis 2012   Hypertension, postpartum condition or complication 2008   No pertinent past medical history    Pyelonephritis 2012   Status post repeat low transverse cesarean section 05/20/2012    Past Surgical History:  Procedure Laterality Date   CESAREAN SECTION  12/2006   baby's head position   CESAREAN SECTION  01/15/2011   Procedure: CESAREAN SECTION;  Surgeon: Michael Litter, MD;  Location: [facility];  Service: Gynecology;  Laterality: N/A;   CESAREAN SECTION N/A 05/20/2012   Procedure: CESAREAN SECTION REPEAT ;  Surgeon: Michael Litter, MD;  Location: [facility];  Service: Obstetrics;  Laterality: N/A;  Repeat   CESAREAN SECTION N/A 09/26/2017   Procedure: REPEAT CESAREAN SECTION;  Surgeon: Silverio Lay, MD;  Location: [facility];  Service: Obstetrics;  Laterality: N/A;    OB History  Gravida Para Term Preterm AB Living  7 4 4   2 4   SAB IAB Ectopic Multiple Live Births    2   0 4    # Outcome Date GA Lbr Len/2nd Weight Sex  Delivery Anes PTL Lv  7 Current           6 Term 09/26/17 [redacted]w[redacted]d  3170 g M CS-LTranv Spinal  LIV  5 Term 05/20/12 [redacted]w[redacted]d  3560 g F CS-LTranv Spinal  LIV  4 Term 01/15/11 [redacted]w[redacted]d  2690 g M CS-LTranv EPI N LIV     Birth Comments: System Generated. Please review and update pregnancy details.  3 IAB 2011 [redacted]w[redacted]d         2 IAB 2010 [redacted]w[redacted]d         1 Term 12/2006   2892 g F CS-LTranv EPI N LIV     Birth Comments: Baby's head position born in [place].     Social History   Socioeconomic History   Marital status: Married    Spouse name: Not on file   Number of children: Not on file   Years of education: Not on file   Highest education level: Not on file  Occupational History   Not on file  Tobacco Use   Smoking status: Never   Smokeless tobacco: Never  Vaping Use   Vaping Use: Never used  Substance and Sexual Activity   Alcohol use: No   Drug use: No   Sexual activity: Not Currently    Birth control/protection: None    Comment: pregnant  Other Topics Concern   Not on file  Social History Narrative   Not on file   Social  Determinants of Health   Financial Resource Strain: Low Risk  (09/17/2017)   Overall Financial Resource Strain (CARDIA)    Difficulty of Paying Living Expenses: Not hard at all  Food Insecurity: No Food Insecurity (09/17/2017)   Hunger Vital Sign    Worried About Running Out of Food in the Last Year: Never true    Ran Out of Food in the Last Year: Never true  Transportation Needs: Unknown (09/17/2017)   PRAPARE - Administrator, Civil Service (Medical): No    Lack of Transportation (Non-Medical): Not on file  Physical Activity: Inactive (09/17/2017)   Exercise Vital Sign    Days of Exercise per Week: 0 days    Minutes of Exercise per Session: 0 min  Stress: Not on file  Social Connections: Unknown (09/26/2017)   Social Connection and Isolation Panel [NHANES]    Frequency of Communication with Friends and Family: Patient declined    Frequency of Social Gatherings  with Friends and Family: Patient declined    Attends Religious Services: Patient declined    Database administrator or Organizations: Patient declined    Attends Engineer, structural: Patient declined    Marital Status: Patient declined    Family History  Problem Relation Age of Onset   Migraines Mother    Asthma Brother    Diabetes Paternal Aunt    Cancer Maternal Grandmother        breast and esophageal   Heart attack Paternal Grandmother    Hypertension Paternal Grandmother    Diabetes Paternal Grandmother    Hypertension Paternal Grandfather    ADD / ADHD Son    ODD Son     Medications Prior to Admission  Medication Sig Dispense Refill Last Dose   FLUoxetine (PROZAC) 20 MG capsule Take 20 mg by mouth.      HYDROcodone-acetaminophen (NORCO/VICODIN) 5-325 MG tablet Take 1-2 tablets by mouth every 6 (six) hours as needed. (Patient not taking: Reported on 03/12/2018) 6 tablet 0     No Known Allergies  Review of Systems: Negative except for what is mentioned in HPI.     Physical Exam: BP 121/66   Pulse (!) 119   Temp 99.2 F (37.3 C)   Resp 16   Ht 5\' 1"  (1.549 m)   Wt 93 kg   LMP 04/21/2022 (Approximate)   SpO2 100%   BMI 38.73 kg/m  CONSTITUTIONAL: Well-developed, well-nourished female in mild distress.  HENT:  Normocephalic, atraumatic, External right and left ear normal. Oropharynx is clear and moist EYES: Conjunctivae and EOM are normal. Pupils are equal, round, and reactive to light. No scleral icterus.  NECK: Normal range of motion, supple, no masses SKIN: Skin is warm and dry. No rash noted. Not diaphoretic. No erythema. No pallor. NEUROLGIC: Alert and oriented to person, place, and time. Normal reflexes, muscle tone coordination. No cranial nerve deficit noted. PSYCHIATRIC: Normal mood and affect. Normal behavior. Normal judgment and thought content. CARDIOVASCULAR: Tachycardic RESPIRATORY: Effort normal, no problems with respiration  noted ABDOMEN: Soft, moderately tender with mild palpation diffusely across lower abdomen, nondistended PELVIC: malodorous on presentation, dark red blood oozing from os which is open, moderately severe pain with bimanual MUSCULOSKELETAL: Normal range of motion. No edema and no tenderness.   Chaperone present for pelvic exam   Pertinent Labs/Studies:   Results for orders placed or performed during the hospital encounter of 06/22/22 (from the past 72 hour(s))  CBC     Status: Abnormal  Collection Time: 06/22/22  2:38 PM  Result Value Ref Range   WBC 10.1 4.0 - 10.5 K/uL   RBC 2.24 (L) 3.87 - 5.11 MIL/uL   Hemoglobin 7.1 (L) 12.0 - 15.0 g/dL   HCT 13.2 (L) 44.0 - 10.2 %   MCV 92.9 80.0 - 100.0 fL   MCH 31.7 26.0 - 34.0 pg   MCHC 34.1 30.0 - 36.0 g/dL   RDW 72.5 36.6 - 44.0 %   Platelets 185 150 - 400 K/uL   nRBC 0.0 0.0 - 0.2 %    Comment: Performed at [facility] [address] [place], [place] [address]  Comprehensive metabolic panel     Status: Abnormal   Collection Time: 06/22/22  2:38 PM  Result Value Ref Range   Sodium 134 (L) 135 - 145 mmol/L   Potassium 3.2 (L) 3.5 - 5.1 mmol/L   Chloride 104 98 - 111 mmol/L   CO2 21 (L) 22 - 32 mmol/L   Glucose, Bld 96 70 - 99 mg/dL    Comment: Glucose reference range applies only to samples taken after fasting for at least 8 hours.   BUN 9 6 - 20 mg/dL   Creatinine, Ser 5.95 0.44 - 1.00 mg/dL   Calcium 8.2 (L) 8.9 - 10.3 mg/dL   Total Protein 6.5 6.5 - 8.1 g/dL   Albumin 3.2 (L) 3.5 - 5.0 g/dL   AST 22 15 - 41 U/L   ALT 15 0 - 44 U/L   Alkaline Phosphatase 51 38 - 126 U/L   Total Bilirubin 0.7 0.3 - 1.2 mg/dL   GFR, Estimated >63 >87 mL/min    Comment: (NOTE) Calculated using the CKD-EPI Creatinine Equation (2021)    Anion gap 9 5 - 15    Comment: Performed at [facility], [address] [place], [place] [address]  hCG, quantitative, pregnancy     Status: Abnormal   Collection Time: 06/22/22  2:38  PM  Result Value Ref Range   hCG, Beta Chain, Quant, S 26,423 (H) <5 mIU/mL    Comment:          GEST. AGE      CONC.  (mIU/mL)   <=1 WEEK        5 - 50     2 WEEKS       50 - 500     3 WEEKS       100 - 10,000     4 WEEKS     1,000 - 30,000     5 WEEKS     3,500 - 115,000   6-8 WEEKS     12,000 - 270,000    12 WEEKS     15,000 - 220,000        FEMALE AND NON-PREGNANT FEMALE:     LESS THAN 5 mIU/mL Performed at [facility], [address] [place], [place] [address]   Wet prep, genital     Status: Abnormal   Collection Time: 06/22/22  3:50 PM   Specimen: PATH Cytology Cervicovaginal Ancillary Only  Result Value Ref Range   Yeast Wet Prep HPF POC NONE SEEN NONE SEEN   Trich, Wet Prep NONE SEEN NONE SEEN   Clue Cells Wet Prep HPF POC NONE SEEN NONE SEEN   WBC, Wet Prep HPF POC >=10 (A) <10   Sperm NONE SEEN     Comment: Performed at [facility], [address]  [place], [place] [address]       Assessment and Plan :ELZBIETA COTTERILL is a 37 y.o. [id number] here for incomplete abortion, concern for septic abortion with initial temp 102 and tachycardic on arrival to MAU but now down to 99.7. She has had significant blood loss with H/H now 7/20 with ongoing bleeding, concern for infection given temp and tenderness on exam. US shows possible retained products. Recommend proceeding with suction dilation and curettage for incomplete, possible septic abortion.   The risks of suction D&C were reviewed with the patient; including but not limited to: infection which may require antibiotics; bleeding which may require transfusion or re-operation; injury to bowel, bladder, ureters or other surrounding organs; need for additional procedures; thromboembolic phenomenon and other postoperative/anesthesia complications. The patient concurred with the proposed plan, giving informed consent for the procedure. She is agreeable to a blood transfusion in the event of emergency and I rec that  we give one unit pRBCs pre-op, to which she is agreeable given low starting H/H.   OR aware and team has been called in Patient is NPO Anesthesia aware Preoperative prophylactic antibiotics ordered SCDs  Admission labs To OR when ready Rh /sepsis/DIC labs pending    K. Therese Sarah, M.D. Attending Obstetrician & Gynecologist, [facility] for [employer], [facility]

## 2022-06-22 NOTE — Transfer of Care (Signed)
Immediate Anesthesia Transfer of Care Note  Patient: Katherine Riggs  Procedure(s) Performed: DILATATION AND CURETTAGE, SUCTION  Patient Location: PACU  Anesthesia Type:General  Level of Consciousness: drowsy  Airway & Oxygen Therapy: Patient Spontanous Breathing  Post-op Assessment: Report given to RN and Post -op Vital signs reviewed and stable  Post vital signs: Reviewed and stable  Last Vitals:  Vitals Value Taken Time  BP 110/59 06/22/22 2146  Temp    Pulse 99 06/22/22 2153  Resp 19 06/22/22 2153  SpO2 100 % 06/22/22 2153  Vitals shown include unvalidated device data.  Last Pain:  Vitals:   06/22/22 1850  TempSrc: Oral  PainSc:          Complications: No notable events documented.

## 2022-06-23 ENCOUNTER — Encounter (HOSPITAL_COMMUNITY): Payer: Self-pay | Admitting: Obstetrics and Gynecology

## 2022-06-23 DIAGNOSIS — O0737 Sepsis following failed attempted termination of pregnancy: Secondary | ICD-10-CM | POA: Diagnosis present

## 2022-06-23 DIAGNOSIS — Z3A10 10 weeks gestation of pregnancy: Secondary | ICD-10-CM | POA: Diagnosis not present

## 2022-06-23 DIAGNOSIS — O034 Incomplete spontaneous abortion without complication: Secondary | ICD-10-CM | POA: Diagnosis not present

## 2022-06-23 DIAGNOSIS — Z6841 Body Mass Index (BMI) 40.0 and over, adult: Secondary | ICD-10-CM | POA: Diagnosis not present

## 2022-06-23 DIAGNOSIS — A419 Sepsis, unspecified organism: Secondary | ICD-10-CM | POA: Diagnosis present

## 2022-06-23 DIAGNOSIS — D62 Acute posthemorrhagic anemia: Secondary | ICD-10-CM | POA: Diagnosis present

## 2022-06-23 DIAGNOSIS — E6609 Other obesity due to excess calories: Secondary | ICD-10-CM | POA: Diagnosis present

## 2022-06-23 LAB — BPAM RBC
ISSUE DATE / TIME: 202406082118
Unit Type and Rh: 600

## 2022-06-23 LAB — CBC
HCT: 24.2 % — ABNORMAL LOW (ref 36.0–46.0)
Hemoglobin: 8.4 g/dL — ABNORMAL LOW (ref 12.0–15.0)
MCH: 31.8 pg (ref 26.0–34.0)
MCHC: 34.7 g/dL (ref 30.0–36.0)
MCV: 91.7 fL (ref 80.0–100.0)
Platelets: 157 10*3/uL (ref 150–400)
RBC: 2.64 MIL/uL — ABNORMAL LOW (ref 3.87–5.11)
RDW: 14 % (ref 11.5–15.5)
WBC: 7.3 10*3/uL (ref 4.0–10.5)
nRBC: 0 % (ref 0.0–0.2)

## 2022-06-23 LAB — TYPE AND SCREEN
Antibody Screen: NEGATIVE
Unit division: 0

## 2022-06-23 LAB — PREPARE RBC (CROSSMATCH)

## 2022-06-23 MED ORDER — SODIUM CHLORIDE 0.9% IV SOLUTION: Freq: Once | INTRAVENOUS | Status: AC

## 2022-06-23 NOTE — Progress Notes (Signed)
Gynecology Progress Note  Admission Date: 06/22/2022 Current Date: 06/23/2022 9:12 AM  Katherine Riggs is a 37 y.o. [id number] HD#2, POD 1 admitted for incomplete/septic abortion.     History complicated by: Patient Active Problem List   Diagnosis Date Noted   Incomplete abortion 06/22/2022   Retained products of conception following abortion 06/22/2022   Retained products of conception after miscarriage 06/22/2022   Anemia, postpartum 09/28/2017   Cesarean delivery delivered 09/26/2017   Status post repeat low transverse cesarean section 05/20/2012   TAB X 2 11/21/2011   Hx pyelonephritis 11/21/2011   Rh negative, maternal 01/15/2011   History of cesarean delivery x 2 01/15/2011    ROS and patient/family/surgical history, located on admission H&P note dated 06/22/2022, have been reviewed, and there are no changes except as noted below Yesterday/Overnight Events:  D and E yesterday evening  Subjective:  Pt seen this AM.  She states she feels significantly better.  Pain is much decreased in the lower abdomen.  She denies fever/chills, but does state occasionally she feels hot.  Objective:   Vitals:   06/23/22 0100 06/23/22 0118 06/23/22 0351 06/23/22 0842  BP: (!) 96/45 (!) 95/49 (!) 103/56 (!) 107/56  Pulse: 76 79 64 63  Resp: 16 16 16 20   Temp: 98.3 F (36.8 C) 98.2 F (36.8 C) 97.7 F (36.5 C) 97.8 F (36.6 C)  TempSrc: Oral Oral Oral Oral  SpO2: 98% 97% 99% 98%  Weight:      Height:        Temp:  [97.1 F (36.2 C)-102.5 F (39.2 C)] 97.8 F (36.6 C) (06/09 0842) Pulse Rate:  [63-126] 63 (06/09 0842) Resp:  [15-20] 20 (06/09 0842) BP: (95-127)/(45-76) 107/56 (06/09 0842) SpO2:  [95 %-100 %] 98 % (06/09 0842) Weight:  [93 kg] 93 kg (06/08 1346) I/O last 3 completed shifts: In: 2541 [I.V.:500; Blood:641; IV Piggyback:1400] Out: 350 [Urine:100; Blood:250] No intake/output data recorded.  Intake/Output Summary (Last 24 hours) at 06/23/2022 0912 Last data filed at  06/23/2022 0346 Gross per 24 hour  Intake 2541 ml  Output 350 ml  Net 2191 ml     Current Vital Signs 24h Vital Sign Ranges  T 97.8 F (36.6 C) Temp  Avg: 98.7 F (37.1 C)  Min: 97.1 F (36.2 C)  Max: 102.5 F (39.2 C)  BP (!) 107/56 BP  Min: 95/49  Max: 127/64  HR 63 Pulse  Avg: 98.5  Min: 63  Max: 126  RR 20 Resp  Avg: 16.9  Min: 15  Max: 20  SaO2 98 % Room Air SpO2  Avg: 98.7 %  Min: 95 %  Max: 100 %       24 Hour I/O Current Shift I/O  Time Ins Outs 06/08 0701 - 06/09 0700 In: 2541 [I.V.:500] Out: 350 [Urine:100] No intake/output data recorded.   Patient Vitals for the past 12 hrs:  BP Temp Temp src Pulse Resp SpO2  06/23/22 0842 (!) 107/56 97.8 F (36.6 C) Oral 63 20 98 %  06/23/22 0351 (!) 103/56 97.7 F (36.5 C) Oral 64 16 99 %  06/23/22 0118 (!) 95/49 98.2 F (36.8 C) Oral 79 16 97 %  06/23/22 0100 (!) 96/45 98.3 F (36.8 C) Oral 76 16 98 %  06/23/22 0017 (!) 103/51 98 F (36.7 C) Oral 79 16 98 %  06/23/22 0016 -- 98.3 F (36.8 C) Oral -- -- --  06/22/22 2345 -- 100.1 F (37.8 C) Axillary -- -- --  06/22/22 2314 (!) 106/47 -- -- 88 16 --  06/22/22 2250 (!) 99/53 98.9 F (37.2 C) -- 91 18 97 %  06/22/22 2235 (!) 100/52 -- -- 95 17 97 %  06/22/22 2220 (!) 101/52 -- -- 93 15 96 %  06/22/22 2205 109/63 -- -- 95 18 99 %  06/22/22 2150 (!) 110/59 (!) 97.1 F (36.2 C) -- 97 18 99 %     Patient Vitals for the past 24 hrs:  BP Temp Temp src Pulse Resp SpO2 Height Weight  06/23/22 0842 (!) 107/56 97.8 F (36.6 C) Oral 63 20 98 % -- --  06/23/22 0351 (!) 103/56 97.7 F (36.5 C) Oral 64 16 99 % -- --  06/23/22 0118 (!) 95/49 98.2 F (36.8 C) Oral 79 16 97 % -- --  06/23/22 0100 (!) 96/45 98.3 F (36.8 C) Oral 76 16 98 % -- --  06/23/22 0017 (!) 103/51 98 F (36.7 C) Oral 79 16 98 % -- --  06/23/22 0016 -- 98.3 F (36.8 C) Oral -- -- -- -- --  06/22/22 2345 -- 100.1 F (37.8 C) Axillary -- -- -- -- --  06/22/22 2314 (!) 106/47 -- -- 88 16 -- -- --   06/22/22 2250 (!) 99/53 98.9 F (37.2 C) -- 91 18 97 % -- --  06/22/22 2235 (!) 100/52 -- -- 95 17 97 % -- --  06/22/22 2220 (!) 101/52 -- -- 93 15 96 % -- --  06/22/22 2205 109/63 -- -- 95 18 99 % -- --  06/22/22 2150 (!) 110/59 (!) 97.1 F (36.2 C) -- 97 18 99 % -- --  06/22/22 1951 123/64 -- -- (!) 112 -- -- -- --  06/22/22 1941 121/64 -- -- (!) 116 -- -- -- --  06/22/22 1930 -- 99.2 F (37.3 C) -- -- -- -- -- --  06/22/22 1926 -- -- -- -- -- 100 % -- --  06/22/22 1921 121/66 -- -- (!) 119 -- 100 % -- --  06/22/22 1916 -- -- -- -- -- 99 % -- --  06/22/22 1906 -- -- -- -- -- 100 % -- --  06/22/22 1900 127/64 -- -- (!) 125 -- 100 % -- --  06/22/22 1850 127/60 (!) 102.5 F (39.2 C) Oral (!) 126 16 100 % -- --  06/22/22 1813 -- 99.2 F (37.3 C) Oral -- -- -- -- --  06/22/22 1727 108/60 -- -- (!) 109 18 100 % -- --  06/22/22 1700 108/60 -- -- (!) 109 -- 100 % -- --  06/22/22 1529 113/65 99 F (37.2 C) Oral (!) 108 16 95 % -- --  06/22/22 1346 (!) 118/58 98.4 F (36.9 C) Oral (!) 114 16 100 % -- --  06/22/22 1346 -- -- -- -- -- -- 5\' 1"  (1.549 m) 93 kg    Physical exam: General appearance: alert, cooperative, appears stated age, and no distress Abdomen: soft, non-tender; bowel sounds normal; no masses,  no organomegaly GU: No gross VB Lungs: clear to auscultation bilaterally Heart: regular rate and rhythm Extremities: no lower extremity edema Skin: large linear bruise on right back/flank Psych: appropriate Neurologic: Grossly normal  Medications Current Facility-Administered Medications  Medication Dose Route Frequency Provider Last Rate Last Admin   0.9 %  sodium chloride infusion (Manually program via Guardrails IV Fluids)   Intravenous Once Conan Bowens, MD       0.9 %  sodium chloride infusion   Intravenous Continuous Leroy Libman  M, MD 125 mL/hr at 06/22/22 2016 New Bag at 06/22/22 2016   acetaminophen (OFIRMEV) 10 MG/ML IV            acetaminophen (TYLENOL) tablet  650 mg  650 mg Oral Q4H PRN Warden Fillers, MD       ampicillin (OMNIPEN) 2 g in sodium chloride 0.9 % 100 mL IVPB  2 g Intravenous Q6H Conan Bowens, MD 300 mL/hr at 06/23/22 0802 2 g at 06/23/22 0802   clindamycin (CLEOCIN) IVPB 900 mg  900 mg Intravenous Q8H Conan Bowens, MD 100 mL/hr at 06/23/22 0658 900 mg at 06/23/22 0658   gentamicin (GARAMYCIN) 330 mg in dextrose 5 % 100 mL IVPB  5 mg/kg (Adjusted) Intravenous Q24H Conan Bowens, MD 108.3 mL/hr at 06/22/22 2152 330 mg at 06/22/22 2152   ibuprofen (ADVIL) tablet 600 mg  600 mg Oral Q6H Warden Fillers, MD   600 mg at 06/23/22 1610   lactated ringers infusion   Intravenous Continuous Warden Fillers, MD 100 mL/hr at 06/23/22 0350 IV Pump Association at 06/23/22 0350   magnesium hydroxide (MILK OF MAGNESIA) suspension 30 mL  30 mL Oral Daily PRN Warden Fillers, MD       methylergonovine (METHERGINE) tablet 0.2 mg  0.2 mg Oral Q8H Warden Fillers, MD   0.2 mg at 06/23/22 0653   ondansetron (ZOFRAN) tablet 4 mg  4 mg Oral Q6H PRN Warden Fillers, MD       Or   ondansetron [facility]) injection 4 mg  4 mg Intravenous Q6H PRN Warden Fillers, MD       oxyCODONE (Oxy IR/ROXICODONE) immediate release tablet 5-10 mg  5-10 mg Oral Q4H PRN Warden Fillers, MD       simethicone [facility]) chewable tablet 80 mg  80 mg Oral QID PRN Warden Fillers, MD          Labs  Recent Labs  Lab 06/22/22 1438 06/22/22 2016 06/22/22 2019 06/23/22 0743  WBC 10.1 9.7  --  7.3  HGB 7.1* 7.5*  --  8.4*  HCT 20.8* 23.7*  --  24.2*  PLT 185 192 188 157    Recent Labs  Lab 06/22/22 1438 06/22/22 2016  NA 134* 137  K 3.2* 3.1*  CL 104 106  CO2 21* 18*  BUN 9 7  CREATININE 0.66 0.80  CALCIUM 8.2* 8.5*  PROT 6.5 5.9*  BILITOT 0.7 0.8  ALKPHOS 51 52  ALT 15 16  AST 22 20  GLUCOSE 96 94    Radiology N/a  Assessment & Plan:  POD 1 from suction D and E *GYN: continue triple abx for treatment of possible septic abortion *Pain: currently  well controlled *FEN/GI: continue regular diet *Dispo: if pt remains afebrile and h/h remains stable, anticipate d/c on 06/24/22  Code Status: Full Code  Mariel Aloe, MD Attending Center for [facility])

## 2022-06-23 NOTE — Anesthesia Postprocedure Evaluation (Signed)
Anesthesia Post Note  Patient: MILEE QUALLS  Procedure(s) Performed: DILATATION AND CURETTAGE, SUCTION     Patient location during evaluation: PACU Anesthesia Type: General Level of consciousness: awake and alert Pain management: pain level controlled Vital Signs Assessment: post-procedure vital signs reviewed and stable Respiratory status: spontaneous breathing, nonlabored ventilation, respiratory function stable and patient connected to nasal cannula oxygen Cardiovascular status: blood pressure returned to baseline and stable Postop Assessment: no apparent nausea or vomiting Anesthetic complications: no  No notable events documented.  Last Vitals:  Vitals:   06/23/22 0100 06/23/22 0118  BP: (!) 96/45 (!) 95/49  Pulse: 76 79  Resp: 16 16  Temp: 36.8 C 36.8 C  SpO2: 98% 97%    Last Pain:  Vitals:   06/23/22 0118  TempSrc: Oral  PainSc:                  Shelton Silvas

## 2022-06-24 LAB — CULTURE, BLOOD (ROUTINE X 2)
Culture: NO GROWTH
Special Requests: ADEQUATE

## 2022-06-24 LAB — GC/CHLAMYDIA PROBE AMP (~~LOC~~) NOT AT ARMC
Chlamydia: NEGATIVE
Comment: NEGATIVE
Comment: NORMAL
Neisseria Gonorrhea: NEGATIVE

## 2022-06-24 MED ORDER — IBUPROFEN 600 MG PO TABS: 600.0000 mg | ORAL_TABLET | Freq: Four times a day (QID) | ORAL | 0 refills | Status: AC

## 2022-06-24 NOTE — Discharge Summary (Signed)
Physician Discharge Summary  Patient ID: Katherine Riggs MRN: [id number] DOB/AGE: 1985-09-29 37 y.o.  Admit date: 06/22/2022 Discharge date: 06/24/2022  Admission Diagnoses: Septic abortion  Discharge Diagnoses:  Principal Problem:   Incomplete abortion Active Problems:   Retained products of conception following abortion   Retained products of conception after miscarriage   Discharged Condition: good  Hospital Course: Patient admitted with retained POC and concerns for septic abortion following a medical termination of pregnancy. Patient received IV antibiotic and underwent a D&E. She remained hemodynamically stable and afebrile. Discharge instructions were reviewed with the patient. Patient verbalized understanding and all questions were answered  Consults: None   Discharge Exam: Blood pressure 106/60, pulse (!) 51, temperature 98.1 F (36.7 C), temperature source Oral, resp. rate 14, height 5\' 1"  (1.549 m), weight 93 kg, last menstrual period 04/21/2022, SpO2 99 %, unknown if currently breastfeeding. GENERAL: Well-developed, well-nourished female in no acute distress.  LUNGS: Clear to auscultation bilaterally.  HEART: Regular rate and rhythm. ABDOMEN: Soft, nontender, nondistended. No organomegaly. PELVIC: Not performed EXTREMITIES: No cyanosis, clubbing, or edema, 2+ distal pulses.   Disposition: Home   Allergies as of 06/24/2022   No Known Allergies      Medication List     STOP taking these medications    HYDROcodone-acetaminophen 5-325 MG tablet Commonly known as: NORCO/VICODIN       TAKE these medications    FLUoxetine 20 MG capsule Commonly known as: PROZAC Take 20 mg by mouth.   ibuprofen 600 MG tablet Commonly known as: ADVIL Take 1 tablet (600 mg total) by mouth every 6 (six) hours.         Signed: Liyana Suniga 06/24/2022, 11:16 AM

## 2022-06-25 LAB — CULTURE, BLOOD (ROUTINE X 2)
Culture: NO GROWTH
Special Requests: ADEQUATE

## 2022-06-25 LAB — SURGICAL PATHOLOGY

## 2022-06-25 NOTE — Progress Notes (Signed)
  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

## 2022-06-26 LAB — BPAM RBC
Blood Product Expiration Date: 202406212359
Blood Product Expiration Date: 202407012359
Unit Type and Rh: 600
Unit Type and Rh: 600

## 2022-06-26 LAB — TYPE AND SCREEN
ABO/RH(D): A NEG
Unit division: 0

## 2022-06-27 LAB — CULTURE, BLOOD (ROUTINE X 2)

## 2022-07-09 ENCOUNTER — Ambulatory Visit: Payer: BC Managed Care – PPO | Admitting: Obstetrics and Gynecology
